# Patient Record
Sex: Male | Born: 1980 | Race: Black or African American | Hispanic: No | Marital: Married | Smoking: Former smoker
Health system: Southern US, Community
[De-identification: ages and names within clinical notes are randomized; demographics above are authoritative.]

## PROBLEM LIST (undated history)

## (undated) DIAGNOSIS — I10 Essential (primary) hypertension: Secondary | ICD-10-CM

## (undated) HISTORY — DX: Essential (primary) hypertension: I10

---

## 2006-05-20 ENCOUNTER — Ambulatory Visit: Payer: Self-pay | Admitting: Family Medicine

## 2006-05-21 ENCOUNTER — Ambulatory Visit: Payer: Self-pay | Admitting: *Deleted

## 2006-06-25 ENCOUNTER — Emergency Department (HOSPITAL_COMMUNITY): Admission: EM | Admit: 2006-06-25 | Discharge: 2006-06-25 | Payer: Self-pay | Admitting: Emergency Medicine

## 2006-07-01 ENCOUNTER — Ambulatory Visit: Payer: Self-pay | Admitting: Family Medicine

## 2007-04-22 ENCOUNTER — Encounter (INDEPENDENT_AMBULATORY_CARE_PROVIDER_SITE_OTHER): Payer: Self-pay | Admitting: *Deleted

## 2008-02-18 ENCOUNTER — Emergency Department (HOSPITAL_COMMUNITY): Admission: EM | Admit: 2008-02-18 | Discharge: 2008-02-18 | Payer: Self-pay | Admitting: Emergency Medicine

## 2008-02-20 ENCOUNTER — Emergency Department (HOSPITAL_COMMUNITY): Admission: EM | Admit: 2008-02-20 | Discharge: 2008-02-20 | Payer: Self-pay | Admitting: Emergency Medicine

## 2008-05-15 ENCOUNTER — Emergency Department (HOSPITAL_COMMUNITY): Admission: EM | Admit: 2008-05-15 | Discharge: 2008-05-15 | Payer: Self-pay | Admitting: Emergency Medicine

## 2011-05-03 LAB — URINALYSIS, ROUTINE W REFLEX MICROSCOPIC
Glucose, UA: NEGATIVE
Ketones, ur: NEGATIVE
Leukocytes, UA: NEGATIVE
Protein, ur: 100 — AB

## 2011-05-03 LAB — DIFFERENTIAL
Basophils Absolute: 0
Basophils Relative: 0
Eosinophils Absolute: 0.2
Eosinophils Relative: 2
Monocytes Absolute: 0.8

## 2011-05-03 LAB — CBC
HCT: 44.3
Hemoglobin: 15.5
MCHC: 34.9
MCV: 81.7
RDW: 12.8

## 2011-05-03 LAB — POCT I-STAT, CHEM 8
BUN: 12
BUN: 21
Calcium, Ion: 1.12
Calcium, Ion: 1 — ABNORMAL LOW
Chloride: 106
Glucose, Bld: 111 — ABNORMAL HIGH
TCO2: 27

## 2011-05-03 LAB — RAPID URINE DRUG SCREEN, HOSP PERFORMED
Amphetamines: NOT DETECTED
Cocaine: NOT DETECTED
Opiates: NOT DETECTED
Tetrahydrocannabinol: POSITIVE — AB

## 2011-07-30 ENCOUNTER — Emergency Department: Payer: Self-pay | Admitting: Emergency Medicine

## 2011-08-29 ENCOUNTER — Emergency Department: Payer: Self-pay | Admitting: Emergency Medicine

## 2011-08-29 LAB — CK TOTAL AND CKMB (NOT AT ARMC)
CK, Total: 488 U/L — ABNORMAL HIGH (ref 35–232)
CK-MB: 3.4 ng/mL (ref 0.5–3.6)

## 2011-08-29 LAB — BASIC METABOLIC PANEL
BUN: 13 mg/dL (ref 7–18)
EGFR (Non-African Amer.): >60
Glucose: 84 mg/dL (ref 65–99)
Potassium: 3.5 mmol/L (ref 3.5–5.1)
Sodium: 143 mmol/L (ref 136–145)

## 2011-08-29 LAB — CBC WITH DIFFERENTIAL/PLATELET
Basophil #: 0.1 10*3/uL (ref 0.0–0.1)
Eosinophil %: 3.6 %
Lymphocyte #: 2.9 10*3/uL (ref 1.0–3.6)
MCV: 85 fL (ref 80–100)
Monocyte %: 6.8 %
Neutrophil %: 56.8 %
Platelet: 280 10*3/uL (ref 150–440)
RBC: 4.69 10*6/uL (ref 4.40–5.90)
RDW: 12.9 % (ref 11.5–14.5)
WBC: 8.9 10*3/uL (ref 3.8–10.6)

## 2011-08-29 LAB — TROPONIN I: Troponin-I: <0.02 ng/mL

## 2011-11-24 ENCOUNTER — Emergency Department: Payer: Self-pay | Admitting: Emergency Medicine

## 2011-11-25 LAB — BASIC METABOLIC PANEL
Chloride: 107 mmol/L (ref 98–107)
Co2: 24 mmol/L (ref 21–32)
Glucose: 91 mg/dL (ref 65–99)
Osmolality: 286 (ref 275–301)
Sodium: 141 mmol/L (ref 136–145)

## 2011-11-25 LAB — CBC
HGB: 14.6 g/dL (ref 13.0–18.0)
MCV: 83 fL (ref 80–100)
RBC: 5.15 10*6/uL (ref 4.40–5.90)
RDW: 13.1 % (ref 11.5–14.5)

## 2011-11-25 LAB — TROPONIN I: Troponin-I: <0.02 ng/mL

## 2012-03-06 ENCOUNTER — Emergency Department: Payer: Self-pay | Admitting: Unknown Physician Specialty

## 2013-02-10 ENCOUNTER — Emergency Department: Payer: Self-pay | Admitting: Emergency Medicine

## 2013-07-07 ENCOUNTER — Ambulatory Visit (INDEPENDENT_AMBULATORY_CARE_PROVIDER_SITE_OTHER): Payer: BC Managed Care – PPO | Admitting: Internal Medicine

## 2013-07-07 ENCOUNTER — Encounter: Payer: Self-pay | Admitting: Internal Medicine

## 2013-07-07 VITALS — BP 126/90 | HR 76 | Temp 98.6°F | Ht 65.25 in | Wt 258.5 lb

## 2013-07-07 DIAGNOSIS — Z0000 Encounter for general adult medical examination without abnormal findings: Secondary | ICD-10-CM | POA: Insufficient documentation

## 2013-07-07 DIAGNOSIS — I10 Essential (primary) hypertension: Secondary | ICD-10-CM

## 2013-07-07 LAB — HEPATIC FUNCTION PANEL
ALT: 27 U/L (ref 0–53)
AST: 19 U/L (ref 0–37)
Albumin: 3.9 g/dL (ref 3.5–5.2)
Alkaline Phosphatase: 82 U/L (ref 39–117)
Bilirubin, Direct: 0 mg/dL (ref 0.0–0.3)
Total Bilirubin: 0.4 mg/dL (ref 0.3–1.2)
Total Protein: 7.8 g/dL (ref 6.0–8.3)

## 2013-07-07 LAB — LIPID PANEL
Cholesterol: 155 mg/dL (ref 0–200)
HDL: 28.2 mg/dL — ABNORMAL LOW (ref >39.00)
LDL Cholesterol: 95 mg/dL (ref 0–99)
Total CHOL/HDL Ratio: 5
Triglycerides: 158 mg/dL — ABNORMAL HIGH (ref 0.0–149.0)
VLDL: 31.6 mg/dL (ref 0.0–40.0)

## 2013-07-07 LAB — CBC WITH DIFFERENTIAL/PLATELET
Basophils Absolute: 0.1 K/uL (ref 0.0–0.1)
Basophils Relative: 0.6 % (ref 0.0–3.0)
Eosinophils Absolute: 0.3 K/uL (ref 0.0–0.7)
Eosinophils Relative: 3.4 % (ref 0.0–5.0)
HCT: 44 % (ref 39.0–52.0)
Hemoglobin: 14.8 g/dL (ref 13.0–17.0)
Lymphocytes Relative: 34.9 % (ref 12.0–46.0)
Lymphs Abs: 3.1 K/uL (ref 0.7–4.0)
MCHC: 33.7 g/dL (ref 30.0–36.0)
MCV: 82.8 fl (ref 78.0–100.0)
Monocytes Absolute: 0.6 K/uL (ref 0.1–1.0)
Monocytes Relative: 6.9 % (ref 3.0–12.0)
Neutro Abs: 4.9 K/uL (ref 1.4–7.7)
Neutrophils Relative %: 54.2 % (ref 43.0–77.0)
Platelets: 286 K/uL (ref 150.0–400.0)
RBC: 5.31 Mil/uL (ref 4.22–5.81)
RDW: 13.5 % (ref 11.5–14.6)
WBC: 9 K/uL (ref 4.5–10.5)

## 2013-07-07 LAB — BASIC METABOLIC PANEL WITH GFR
BUN: 15 mg/dL (ref 6–23)
CO2: 28 meq/L (ref 19–32)
Calcium: 9.2 mg/dL (ref 8.4–10.5)
Chloride: 104 meq/L (ref 96–112)
Creatinine, Ser: 1.4 mg/dL (ref 0.4–1.5)
GFR: 73.15 mL/min (ref >60.00)
Glucose, Bld: 93 mg/dL (ref 70–99)
Potassium: 3.9 meq/L (ref 3.5–5.1)
Sodium: 140 meq/L (ref 135–145)

## 2013-07-07 LAB — T4, FREE: Free T4: 0.87 ng/dL (ref 0.60–1.60)

## 2013-07-07 LAB — TSH: TSH: 1.65 u[IU]/mL (ref 0.35–5.50)

## 2013-07-07 NOTE — Assessment & Plan Note (Signed)
Healthy but out of shape Discussed fitness Diet info given

## 2013-07-07 NOTE — Progress Notes (Signed)
Subjective:    Patient ID: Scott Hodge, male    DOB: 16-Jun-1981, 32 y.o.   MRN: [id number]  HPI Transferring care Had been in prison until about 2 years ago. Had been selling cocaine. 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   No current facility-administered medications on file prior to visit.    No Known Allergies  Past Medical History  Diagnosis Date  . Hypertension     No past surgical history on file.  Family History  Problem Relation Age of Onset  . Hypertension Mother   . Hypertension Father   . Cancer Paternal Aunt     died of unknown cancer  . Diabetes Paternal Uncle   . Heart disease Neg Hx     History   Social History  . Marital Status: Married    Spouse Name: N/A    Number of Children: 2  . Years of Education: N/A   Occupational History  . Peer counselor     [employer]   Social History Main Topics  . Smoking status: Current Every Day Smoker -- 2 years    Types: Cigarettes  . Smokeless tobacco: Never Used     Comment: smokes only 3 per day, trying to quit  . Alcohol Use: Yes     Comment: socially  . Drug Use: No  . Sexual Activity: Not on file   Other Topics Concern  . Not on file   Social History Narrative   2 step children   Review of Systems  Constitutional: Negative for fatigue and unexpected weight change.       Wears seat belt  HENT: Positive for dental problem. Negative for congestion, hearing loss and rhinorrhea.        Needs some work done on teeth  Eyes: Negative for visual disturbance.        No diplopia or unilateral vision loss  Respiratory: Negative for cough, chest tightness and shortness of  breath.   Cardiovascular: Positive for palpitations. Negative for chest pain and leg swelling.       Palps if Scott Hodge misses meds  Gastrointestinal: Negative for nausea, vomiting, abdominal pain, constipation and blood in stool.       No heartburn  Endocrine: Negative for cold intolerance and heat intolerance.  Genitourinary: Negative for frequency and difficulty urinating.       Rare urgency on the diuretic  Musculoskeletal: Negative for arthralgias, back pain and joint swelling.  Skin: Positive for rash.       Mild eczema in past  Allergic/Immunologic: Negative for environmental allergies and immunocompromised state.  Neurological: Positive for headaches. Negative for dizziness, syncope, weakness, light-headedness and numbness.       Headaches if Scott Hodge misses BP meds  Hematological: Negative for adenopathy. Does not bruise/bleed easily.  Psychiatric/Behavioral: Negative for sleep disturbance and dysphoric mood. The patient is not nervous/anxious.        Objective:   Physical Exam  Constitutional: Scott Hodge is oriented to person, place, and time. Scott Hodge appears well-developed and well-nourished. No distress.  HENT:  Head: Normocephalic and atraumatic.  Right Ear: External ear normal.  Left Ear: External ear normal.  Mouth/Throat:  Oropharynx is clear and moist. No oropharyngeal exudate.  Eyes: Conjunctivae and EOM are normal. Pupils are equal, round, and reactive to light.  Neck: Normal range of motion. Neck supple. No thyromegaly present.  Cardiovascular: Normal rate, regular rhythm, normal heart sounds and intact distal pulses.  Exam reveals no gallop.   No murmur heard. Pulmonary/Chest: Effort normal and breath sounds normal. No respiratory distress. Scott Hodge has no wheezes. Scott Hodge has no rales.  Abdominal: Soft. There is no tenderness.  Musculoskeletal: Scott Hodge exhibits no edema and no tenderness.  Lymphadenopathy:    Scott Hodge has no cervical adenopathy.  Neurological: Scott Hodge is alert and oriented to person, place, and  time.  Skin: No rash noted. No erythema.  Psychiatric: Scott Hodge has a normal mood and affect. His behavior is normal.          Assessment & Plan:

## 2013-07-07 NOTE — Assessment & Plan Note (Signed)
BP Readings from Last 3 Encounters:  07/07/13 126/90   Has symptoms off the med Mild ED---would change to ACEI or CCB if worsens

## 2013-07-07 NOTE — Progress Notes (Signed)
Pre-visit discussion using our clinic review tool. No additional management support is needed unless otherwise documented below in the visit note.  

## 2013-07-07 NOTE — Patient Instructions (Signed)
DASH Diet  The DASH diet stands for "Dietary Approaches to Stop Hypertension." It is a healthy eating plan that has been shown to reduce high blood pressure (hypertension) in as little as 14 days, while also possibly providing other significant health benefits. These other health benefits include reducing the risk of breast cancer after menopause and reducing the risk of type 2 diabetes, heart disease, colon cancer, and stroke. Health benefits also include weight loss and slowing kidney failure in patients with chronic kidney disease.   DIET GUIDELINES  · Limit salt (sodium). Your diet should contain less than 1500 mg of sodium daily.  · Limit refined or processed carbohydrates. Your diet should include mostly whole grains. Desserts and added sugars should be used sparingly.  · Include small amounts of heart-healthy fats. These types of fats include nuts, oils, and tub margarine. Limit saturated and trans fats. These fats have been shown to be harmful in the body.  CHOOSING FOODS   The following food groups are based on a 2000 calorie diet. See your Registered Dietitian for individual calorie needs.  Grains and Grain Products (6 to 8 servings daily)  · Eat More Often: Whole-wheat bread, brown rice, whole-grain or wheat pasta, quinoa, popcorn without added fat or salt (air popped).  · Eat Less Often: White bread, white pasta, white rice, cornbread.  Vegetables (4 to 5 servings daily)  · Eat More Often: Fresh, frozen, and canned vegetables. Vegetables may be raw, steamed, roasted, or grilled with a minimal amount of fat.  · Eat Less Often/Avoid: Creamed or fried vegetables. Vegetables in a cheese sauce.  Fruit (4 to 5 servings daily)  · Eat More Often: All fresh, canned (in natural juice), or frozen fruits. Dried fruits without added sugar. One hundred percent fruit juice (½ cup [237 mL] daily).  · Eat Less Often: Dried fruits with added sugar. Canned fruit in light or heavy syrup.  Lean Meats, Fish, and Poultry (2  servings or less daily. One serving is 3 to 4 oz [85-114 g]).  · Eat More Often: Ninety percent or leaner ground beef, tenderloin, sirloin. Round cuts of beef, chicken breast, turkey breast. All fish. Grill, bake, or broil your meat. Nothing should be fried.  · Eat Less Often/Avoid: Fatty cuts of meat, turkey, or chicken leg, thigh, or wing. Fried cuts of meat or fish.  Dairy (2 to 3 servings)  · Eat More Often: Low-fat or fat-free milk, low-fat plain or light yogurt, reduced-fat or part-skim cheese.  · Eat Less Often/Avoid: Milk (whole, 2%). Whole milk yogurt. Full-fat cheeses.  Nuts, Seeds, and Legumes (4 to 5 servings per week)  · Eat More Often: All without added salt.  · Eat Less Often/Avoid: Salted nuts and seeds, canned beans with added salt.  Fats and Sweets (limited)  · Eat More Often: Vegetable oils, tub margarines without trans fats, sugar-free gelatin. Mayonnaise and salad dressings.  · Eat Less Often/Avoid: Coconut oils, palm oils, butter, stick margarine, cream, half and half, cookies, candy, pie.  FOR MORE INFORMATION  The Dash Diet Eating Plan: www.dashdiet.org  Document Released: 07/11/2011 Document Revised: 10/14/2011 Document Reviewed: 07/11/2011  ExitCare® Patient Information ©2014 [employer], LLC.

## 2013-07-08 ENCOUNTER — Other Ambulatory Visit: Payer: Self-pay | Admitting: Internal Medicine

## 2013-07-08 MED ORDER — HYDROCHLOROTHIAZIDE 25 MG PO TABS: 25.0000 mg | ORAL_TABLET | Freq: Every day | ORAL | Status: DC

## 2013-08-06 ENCOUNTER — Encounter: Payer: Self-pay | Admitting: Internal Medicine

## 2013-08-27 ENCOUNTER — Ambulatory Visit: Payer: BC Managed Care – PPO | Admitting: Internal Medicine

## 2013-08-27 ENCOUNTER — Telehealth: Payer: Self-pay | Admitting: Internal Medicine

## 2013-08-27 DIAGNOSIS — Z0289 Encounter for other administrative examinations: Secondary | ICD-10-CM

## 2013-08-27 NOTE — Telephone Encounter (Signed)
Patient Information:  Caller Name: Scott Hodge  Phone: [phone]  Patient: ,   Gender: Male  DOB: 08/02/1981  Age: 33 Years  PCP: [facility] [facility] Practice)  Office Follow Up:  Does the office need to follow up with this patient?: No  Instructions For The Office: N/A   Symptoms  Reason For Call & Symptoms: Seen at [facility] and dx with Cold Virus with sinus congestion two weeks ago. He was told to use nasal sprays and sinus sx are improved but R ear feels blocked. No pain but ear crackles and pops and feels moist. No pus or drainage.  Afebrile.  Reviewed Health History In EMR: Yes  Reviewed Medications In EMR: Yes  Reviewed Allergies In EMR: Yes  Reviewed Surgeries / Procedures: Yes  Date of Onset of Symptoms: 08/13/2013  Treatments Tried: Afrin Nasal Spray- used for 3 days and Saline Nasal spray several times daily  Treatments Tried Worked: No  Guideline(s) Used:  Ear - Congestion  Disposition Per Guideline:   See Within 3 Days in Office  Reason For Disposition Reached:   Ear congestion present > 48 hours  Advice Given:  Causes  Upper respiratory infections (colds) and nasal allergies (hay fever) can block the eustachian tube.  Blowing the nose too hard can also push air and fluid into the eustachian tube.  Caution - Nasal Decongestants:  Do not take these medications if you have high blood pressure, heart disease, prostate problems, or an overactive thyroid.  Expected Course:   The symptoms usually get better within 2 days (48 hours) with treatment.  Call Back If:   Ear congestion lasts over 48 hours  Ear pain or fever occurs  You become worse.  RN Overrode Recommendation:  Make Appointment  Appointment already scheduled for today 08/27/13 at 1500  Appointment Scheduled:  08/27/2013 15:00:00 Appointment Scheduled Provider:  Other

## 2013-08-27 NOTE — Telephone Encounter (Signed)
Will eval at [facility]

## 2014-03-22 ENCOUNTER — Encounter (HOSPITAL_COMMUNITY): Payer: Self-pay | Admitting: Emergency Medicine

## 2014-03-22 ENCOUNTER — Emergency Department (HOSPITAL_COMMUNITY)
Admission: EM | Admit: 2014-03-22 | Discharge: 2014-03-22 | Disposition: A | Discharge status: Home / Self Care | Payer: BC Managed Care – PPO | Attending: Emergency Medicine | Admitting: Emergency Medicine

## 2014-03-22 DIAGNOSIS — Z79899 Other long term (current) drug therapy: Secondary | ICD-10-CM | POA: Insufficient documentation

## 2014-03-22 DIAGNOSIS — L299 Pruritus, unspecified: Secondary | ICD-10-CM | POA: Insufficient documentation

## 2014-03-22 DIAGNOSIS — R21 Rash and other nonspecific skin eruption: Secondary | ICD-10-CM | POA: Insufficient documentation

## 2014-03-22 DIAGNOSIS — L309 Dermatitis, unspecified: Secondary | ICD-10-CM

## 2014-03-22 DIAGNOSIS — I10 Essential (primary) hypertension: Secondary | ICD-10-CM | POA: Insufficient documentation

## 2014-03-22 DIAGNOSIS — F17200 Nicotine dependence, unspecified, uncomplicated: Secondary | ICD-10-CM | POA: Insufficient documentation

## 2014-03-22 DIAGNOSIS — L259 Unspecified contact dermatitis, unspecified cause: Secondary | ICD-10-CM | POA: Insufficient documentation

## 2014-03-22 DIAGNOSIS — IMO0002 Reserved for concepts with insufficient information to code with codable children: Secondary | ICD-10-CM | POA: Insufficient documentation

## 2014-03-22 MED ORDER — PREDNISONE 20 MG PO TABS: 40.0000 mg | ORAL_TABLET | Freq: Every day | ORAL | Status: DC

## 2014-03-22 MED ORDER — DIPHENHYDRAMINE HCL 25 MG PO TABS: 25.0000 mg | ORAL_TABLET | Freq: Four times a day (QID) | ORAL | Status: DC | PRN

## 2014-03-22 MED ORDER — PERMETHRIN 5 % EX CREA: TOPICAL_CREAM | CUTANEOUS | Status: DC

## 2014-03-22 MED ORDER — RANITIDINE HCL 150 MG PO CAPS: 150.0000 mg | ORAL_CAPSULE | Freq: Every day | ORAL | Status: DC

## 2014-03-22 NOTE — ED Provider Notes (Signed)
CSN: [id number]     Arrival date & time 03/22/14  0510 History   First MD Initiated Contact with Patient 03/22/14 [phone]     Chief Complaint  Patient presents with  . Rash     (Consider location/radiation/quality/duration/timing/severity/associated sxs/prior Treatment) Patient is a 33 y.o. male presenting with rash. The history is provided by the patient. No language interpreter was used.  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 Hypertension    History reviewed. No pertinent past surgical history. Family History  Problem Relation Age of Onset  . Hypertension Mother   . Hypertension Father   . Cancer Paternal Aunt     died of unknown cancer  . Diabetes Paternal Uncle   . Heart disease Neg Hx    History  Substance Use Topics  . Smoking status: Current Every Day Smoker -- 0.10 packs/day for 2 years    Types: Cigarettes  . Smokeless tobacco: Never Used     Comment: smokes only 3 per day, trying to quit  . Alcohol Use: Yes     Comment: socially    Review of Systems  Constitutional: Negative for fever.  HENT: Negative for hoarse voice.   Respiratory: Negative for shortness of breath and wheezing.   Gastrointestinal: Negative for vomiting and diarrhea.  Musculoskeletal: Negative for arthralgias and myalgias.  Skin: Positive for rash.  All other systems  reviewed and are negative.    Allergies  Review of patient's allergies indicates no known allergies.  Home Medications   Prior to Admission medications   Medication Sig Start Date End Date Taking? Authorizing Provider  hydrochlorothiazide (HYDRODIURIL) 25 MG tablet Take 1 tablet (25 mg total) by mouth daily. 07/08/13  Yes Venia Carbon, MD  diphenhydrAMINE (BENADRYL) 25 MG tablet Take 1 tablet (25 mg total) by mouth every 6 (six) hours as needed for itching (Rash). 03/22/14   Antonietta Breach, PA-C  permethrin (ELIMITE) 5 % cream Apply to entire body other than face - let sit for 14 hours then wash off, may repeat in 1 week if still having symptoms 03/22/14   Antonietta Breach, PA-C  predniSONE (DELTASONE) 20 MG tablet Take 2 tablets (40 mg total) by mouth daily. For itching 03/22/14   Antonietta Breach, PA-C  ranitidine (ZANTAC) 150 MG capsule Take 1 capsule (150 mg total) by mouth daily. For itching 03/22/14   Antonietta Breach, PA-C   BP 120/64  Pulse 88  Temp(Src) 97.8 F (36.6 C) (Oral)  Resp 18  Ht 5\' 7"  (1.702 m)  Wt 255 lb (115.667 kg)  BMI 39.93 kg/m2  SpO2 96%  Physical Exam  Nursing note and vitals reviewed. Constitutional: He is oriented to person, place, and time. He appears well-developed and well-nourished. No distress.  Nontoxic/nonseptic appearing  HENT:  Head: Normocephalic and atraumatic.  Mouth/Throat: Oropharynx is clear and moist. No oropharyngeal exudate.  No oral lesions. Patient tolerating  secretions no difficulty. No angioedema. No voice changes.  Eyes: Conjunctivae and EOM are normal. No scleral icterus.  Neck: Normal range of motion.  Pulmonary/Chest: Effort normal. No respiratory distress. He has no wheezes.  Chest expansion symmetric  Musculoskeletal: Normal range of motion.  Neurological: He is alert and oriented to person, place, and time. He exhibits normal muscle tone. Coordination normal.  Skin: Skin is warm and dry. Rash noted. He is not diaphoretic. No erythema.  No pallor.  Diffuse punctate papular rash appreciated to trunk, back, and upper extremities. Rash is pruritic and mildly erythematous. No weeping, drainage, skin peeling, or plaques.  Psychiatric: He has a normal mood and affect. His behavior is normal.    ED Course  Procedures (including critical care time) Labs Review Labs Reviewed - No data to display  Imaging Review No results found.   EKG Interpretation None      MDM   Final diagnoses:  Dermatitis  Pruritus    33 year old male presents to the emergency department for further evaluation of rash. Patient well and nontoxic appearing, hemodynamically stable, and afebrile. No evidence of airway compromise or angioedema. No oral lesions. Rash not concerning for SJS, erythema multiforme major, or erythema multiforme minor. Symptoms consistent with contact dermatitis; however, given patient's occupation will cover for scabies. Patient stable for discharge with instruction to follow up with his primary care doctor. Return precautions provided and patient agreeable to plan with no unaddressed concerns.   Filed Vitals:   03/22/14 0519 03/22/14 0555  BP: 135/79 120/64  Pulse: 82 88  Temp: 97.8 F (36.6 C)   TempSrc: Oral   Resp: 16 18  Height: 5\' 7"  (1.702 m)   Weight: 255 lb (115.667 kg)   SpO2: 98% 96%     Antonietta Breach, PA-C 03/25/14 [phone]

## 2014-03-22 NOTE — ED Notes (Signed)
Patient here with complaint of diffuse rash over entire body. Explains that rash started a couple weeks ago and gradually has gotten worse. Currently appears as scattered hives and white spots. Denies shortness of breath, dizziness, light headedness, chest pain. States "my wife started using a new fabric softener".

## 2014-03-22 NOTE — Discharge Instructions (Signed)
Take zantac and prednisone as prescribed. You may also take Benadryl in addition for itching. Use Elimite as directed to cover for scabies. Recommend you also discontinue use of your new fabric softener. Follow up with your primary care doctor.  Contact Dermatitis Contact dermatitis is a reaction to certain substances that touch the skin. Contact dermatitis can be either irritant contact dermatitis or allergic contact dermatitis. Irritant contact dermatitis does not require previous exposure to the substance for a reaction to occur.Allergic contact dermatitis only occurs if you have been exposed to the substance before. Upon a repeat exposure, your body reacts to the substance.  CAUSES  Many substances can cause contact dermatitis. Irritant dermatitis is most commonly caused by repeated exposure to mildly irritating substances, such as:  Makeup.  Soaps.  Detergents.  Bleaches.  Acids.  Metal salts, such as nickel. Allergic contact dermatitis is most commonly caused by exposure to:  Poisonous plants.  Chemicals (deodorants, shampoos).  Jewelry.  Latex.  Neomycin in triple antibiotic cream.  Preservatives in products, including clothing. SYMPTOMS  The area of skin that is exposed may develop:  Dryness or flaking.  Redness.  Cracks.  Itching.  Pain or a burning sensation.  Blisters. With allergic contact dermatitis, there may also be swelling in areas such as the eyelids, mouth, or genitals.  DIAGNOSIS  Your caregiver can usually tell what the problem is by doing a physical exam. In cases where the cause is uncertain and an allergic contact dermatitis is suspected, a patch skin test may be performed to help determine the cause of your dermatitis. TREATMENT Treatment includes protecting the skin from further contact with the irritating substance by avoiding that substance if possible. Barrier creams, powders, and gloves may be helpful. Your caregiver may also  recommend:  Steroid creams or ointments applied 2 times daily. For best results, soak the rash area in cool water for 20 minutes. Then apply the medicine. Cover the area with a plastic wrap. You can store the steroid cream in the refrigerator for a "chilly" effect on your rash. That may decrease itching. Oral steroid medicines may be needed in more severe cases.  Antibiotics or antibacterial ointments if a skin infection is present.  Antihistamine lotion or an antihistamine taken by mouth to ease itching.  Lubricants to keep moisture in your skin.  Burow's solution to reduce redness and soreness or to dry a weeping rash. Mix one packet or tablet of solution in 2 cups cool water. Dip a clean washcloth in the mixture, wring it out a bit, and put it on the affected area. Leave the cloth in place for 30 minutes. Do this as often as possible throughout the day.  Taking several cornstarch or baking soda baths daily if the area is too large to cover with a washcloth. Harsh chemicals, such as alkalis or acids, can cause skin damage that is like a burn. You should flush your skin for 15 to 20 minutes with cold water after such an exposure. You should also seek immediate medical care after exposure. Bandages (dressings), antibiotics, and pain medicine may be needed for severely irritated skin.  HOME CARE INSTRUCTIONS  Avoid the substance that caused your reaction.  Keep the area of skin that is affected away from hot water, soap, sunlight, chemicals, acidic substances, or anything else that would irritate your skin.  Do not scratch the rash. Scratching may cause the rash to become infected.  You may take cool baths to help stop the itching.  Only take over-the-counter or prescription medicines as directed by your caregiver.  See your caregiver for follow-up care as directed to make sure your skin is healing properly. SEEK MEDICAL CARE IF:   Your condition is not better after 3 days of  treatment.  You seem to be getting worse.  You see signs of infection such as swelling, tenderness, redness, soreness, or warmth in the affected area.  You have any problems related to your medicines. Document Released: 07/19/2000 Document Revised: 10/14/2011 Document Reviewed: 12/25/2010 [facility] Patient Information 2015 [place], [place]. This information is not intended to replace advice given to you by your health care provider. Make sure you discuss any questions you have with your health care provider.  Scabies Scabies are small bugs (mites) that burrow under the skin and cause red bumps and severe itching. These bugs can only be seen with a microscope. Scabies are highly contagious. They can spread easily from person to person by direct contact. They are also spread through sharing clothing or linens that have the scabies mites living in them. It is not unusual for an entire family to become infected through shared towels, clothing, or bedding.  HOME CARE INSTRUCTIONS   Your caregiver may prescribe a cream or lotion to kill the mites. If cream is prescribed, massage the cream into the entire body from the neck to the bottom of both feet. Also massage the cream into the scalp and face if your child is less than 424 year old. Avoid the eyes and mouth. Do not wash your hands after application.  Leave the cream on for 8 to 12 hours. Your child should bathe or shower after the 8 to 12 hour application period. Sometimes it is helpful to apply the cream to your child right before bedtime.  One treatment is usually effective and will eliminate approximately 95% of infestations. For severe cases, your caregiver may decide to repeat the treatment in 1 week. Everyone in your household should be treated with one application of the cream.  New rashes or burrows should not appear within 24 to 48 hours after successful treatment. However, the itching and rash may last for 2 to 4 weeks after successful  treatment. Your caregiver may prescribe a medicine to help with the itching or to help the rash go away more quickly.  Scabies can live on clothing or linens for up to 3 days. All of your child's recently used clothing, towels, stuffed toys, and bed linens should be washed in hot water and then dried in a dryer for at least 20 minutes on high heat. Items that cannot be washed should be enclosed in a plastic bag for at least 3 days.  To help relieve itching, bathe your child in a cool bath or apply cool washcloths to the affected areas.  Your child may return to school after treatment with the prescribed cream. SEEK MEDICAL CARE IF:   The itching persists longer than 4 weeks after treatment.  The rash spreads or becomes infected. Signs of infection include red blisters or yellow-tan crust. Document Released: 07/22/2005 Document Revised: 10/14/2011 Document Reviewed: 11/30/2008 [facility] AssociatesExitCare Patient Information 2015 [place]Care, [place]. This information is not intended to replace advice given to you by your health care provider. Make sure you discuss any questions you have with your health care provider.

## 2014-03-25 NOTE — ED Provider Notes (Signed)
Medical screening examination/treatment/procedure(s) were performed by non-physician practitioner and as supervising physician I was immediately available for consultation/collaboration.   EKG Interpretation None       Olivia Mackielga M Esthela Brandner, MD 03/25/14 2114

## 2014-04-11 ENCOUNTER — Emergency Department (HOSPITAL_COMMUNITY)
Admission: EM | Admit: 2014-04-11 | Discharge: 2014-04-11 | Disposition: A | Discharge status: Home / Self Care | Payer: BC Managed Care – PPO | Attending: Emergency Medicine | Admitting: Emergency Medicine

## 2014-04-11 ENCOUNTER — Encounter (HOSPITAL_COMMUNITY): Payer: Self-pay | Admitting: Emergency Medicine

## 2014-04-11 DIAGNOSIS — F17200 Nicotine dependence, unspecified, uncomplicated: Secondary | ICD-10-CM | POA: Insufficient documentation

## 2014-04-11 DIAGNOSIS — IMO0002 Reserved for concepts with insufficient information to code with codable children: Secondary | ICD-10-CM | POA: Insufficient documentation

## 2014-04-11 DIAGNOSIS — J3489 Other specified disorders of nose and nasal sinuses: Secondary | ICD-10-CM | POA: Insufficient documentation

## 2014-04-11 DIAGNOSIS — Z79899 Other long term (current) drug therapy: Secondary | ICD-10-CM | POA: Insufficient documentation

## 2014-04-11 DIAGNOSIS — H6590 Unspecified nonsuppurative otitis media, unspecified ear: Secondary | ICD-10-CM | POA: Insufficient documentation

## 2014-04-11 DIAGNOSIS — I10 Essential (primary) hypertension: Secondary | ICD-10-CM | POA: Insufficient documentation

## 2014-04-11 DIAGNOSIS — H9209 Otalgia, unspecified ear: Secondary | ICD-10-CM | POA: Insufficient documentation

## 2014-04-11 DIAGNOSIS — H6591 Unspecified nonsuppurative otitis media, right ear: Secondary | ICD-10-CM

## 2014-04-11 MED ORDER — AMOXICILLIN 500 MG PO CAPS: 500.0000 mg | ORAL_CAPSULE | Freq: Three times a day (TID) | ORAL | Status: DC

## 2014-04-11 NOTE — ED Provider Notes (Signed)
CSN: [id number]     Arrival date & time 04/11/14  9528 History  This chart was scribed for non-physician practitioner Roxy Horseman, PA-C working with Vida Roller, MD by Leone Payor, ED Scribe. This patient was seen in room TR07C/TR07C and the patient's care was started at 9:12 AM.    No chief complaint on file.   The history is provided by the patient. No language interpreter was used.    HPI Comments: Scott Hodge is a 33 y.o. male who presents to the Emergency Department complaining of 2 weeks of gradual onset, constant right sided otalgia with associated sinus congestion. Patient states he has noticed the affected ear pop and reports associated muffled hearing. He has taken sudafed and mucinex without significant relief. He reports similar symptoms in the past with prior URI symptoms. He denies cough, documented fever.   Past Medical History  Diagnosis Date  . Hypertension    History reviewed. No pertinent past surgical history. Family History  Problem Relation Age of Onset  . Hypertension Mother   . Hypertension Father   . Cancer Paternal Aunt     died of unknown cancer  . Diabetes Paternal Uncle   . Heart disease Neg Hx    History  Substance Use Topics  . Smoking status: Current Every Day Smoker -- 0.10 packs/day for 2 years    Types: Cigarettes  . Smokeless tobacco: Never Used     Comment: smokes only 3 per day, trying to quit  . Alcohol Use: Yes     Comment: socially    Review of Systems  Constitutional: Positive for fever ( subjective). Negative for chills.  HENT: Positive for congestion, ear pain, hearing loss (muffled hearing) and sinus pressure.   Respiratory: Negative for shortness of breath.   Cardiovascular: Negative for chest pain.  Gastrointestinal: Negative for nausea, vomiting, diarrhea and constipation.  Genitourinary: Negative for dysuria.      Allergies  Review of patient's allergies indicates no known allergies.  Home Medications   Prior  to Admission medications   Medication Sig Start Date End Date Taking? Authorizing Provider  diphenhydrAMINE (BENADRYL) 25 MG tablet Take 1 tablet (25 mg total) by mouth every 6 (six) hours as needed for itching (Rash). 03/22/14   Antony Madura, PA-C  hydrochlorothiazide (HYDRODIURIL) 25 MG tablet Take 1 tablet (25 mg total) by mouth daily. 07/08/13   Karie Schwalbe, MD  permethrin (ELIMITE) 5 % cream Apply to entire body other than face - let sit for 14 hours then wash off, may repeat in 1 week if still having symptoms 03/22/14   Antony Madura, PA-C  predniSONE (DELTASONE) 20 MG tablet Take 2 tablets (40 mg total) by mouth daily. For itching 03/22/14   Antony Madura, PA-C  ranitidine (ZANTAC) 150 MG capsule Take 1 capsule (150 mg total) by mouth daily. For itching 03/22/14   Antony Madura, PA-C   BP 132/74  Pulse 83  Temp(Src) 98.4 F (36.9 C) (Oral)  Resp 20  Ht  (1.702 m)  Wt 254 lb 14.6 oz (115.629 kg)  BMI 39.92 kg/m2  SpO2 98% Physical Exam  Nursing note and vitals reviewed. Constitutional: He is oriented to person, place, and time. He appears well-developed and well-nourished.  HENT:  Head: Normocephalic and atraumatic.  Right Ear: External ear normal.  Left Ear: External ear normal.  Mouth/Throat: Oropharynx is clear and moist. No oropharyngeal exudate.  Right TM is erythematous with some effusion seen behind the TM. Left TM is  clear.   Neck: Normal range of motion. Neck supple.  Cardiovascular: Normal rate, regular rhythm and normal heart sounds.  Exam reveals no gallop and no friction rub.   No murmur heard. Pulmonary/Chest: Effort normal and breath sounds normal. No respiratory distress. He has no wheezes. He has no rales.  Abdominal: He exhibits no distension.  Lymphadenopathy:    He has no cervical adenopathy.  Neurological: He is alert and oriented to person, place, and time.  Skin: Skin is warm and dry.  Psychiatric: He has a normal mood and affect.    ED Course   Procedures (including critical care time)  DIAGNOSTIC STUDIES: Oxygen Saturation is 98% on RA, normal by my interpretation.    COORDINATION OF CARE: 9:14 AM Discussed treatment plan with pt at bedside and pt agreed to plan.   Labs Review Labs Reviewed - No data to display  Imaging Review No results found.   EKG Interpretation None      MDM   Final diagnoses:  Right serous otitis media, recurrence not specified, unspecified chronicity    Patient with OM, sick for 2 weeks.  Will treat with amox.  Follow-up with PCP.  I personally performed the services described in this documentation, which was scribed in my presence. The recorded information has been reviewed and is accurate.    Roxy Horseman, PA-C 04/11/14 [phone]

## 2014-04-11 NOTE — Discharge Instructions (Signed)

## 2014-04-11 NOTE — ED Provider Notes (Signed)
Medical screening examination/treatment/procedure(s) were performed by non-physician practitioner and as supervising physician I was immediately available for consultation/collaboration.    Vida Roller, MD 04/11/14 [phone]

## 2014-04-11 NOTE — ED Notes (Signed)
Started 2 weeks ago with "sinus problems". Now has bilateral ear pain, right > left.

## 2014-05-29 ENCOUNTER — Emergency Department (HOSPITAL_COMMUNITY)
Admission: EM | Admit: 2014-05-29 | Discharge: 2014-05-29 | Disposition: A | Discharge status: Home / Self Care | Payer: BC Managed Care – PPO | Attending: Emergency Medicine | Admitting: Emergency Medicine

## 2014-05-29 ENCOUNTER — Encounter (HOSPITAL_COMMUNITY): Payer: Self-pay | Admitting: Emergency Medicine

## 2014-05-29 DIAGNOSIS — R51 Headache: Secondary | ICD-10-CM | POA: Insufficient documentation

## 2014-05-29 DIAGNOSIS — R42 Dizziness and giddiness: Secondary | ICD-10-CM | POA: Insufficient documentation

## 2014-05-29 DIAGNOSIS — Z792 Long term (current) use of antibiotics: Secondary | ICD-10-CM | POA: Insufficient documentation

## 2014-05-29 DIAGNOSIS — Z7952 Long term (current) use of systemic steroids: Secondary | ICD-10-CM | POA: Insufficient documentation

## 2014-05-29 DIAGNOSIS — I10 Essential (primary) hypertension: Secondary | ICD-10-CM | POA: Insufficient documentation

## 2014-05-29 DIAGNOSIS — Z720 Tobacco use: Secondary | ICD-10-CM | POA: Insufficient documentation

## 2014-05-29 DIAGNOSIS — H6983 Other specified disorders of Eustachian tube, bilateral: Secondary | ICD-10-CM

## 2014-05-29 DIAGNOSIS — H6993 Unspecified Eustachian tube disorder, bilateral: Secondary | ICD-10-CM | POA: Insufficient documentation

## 2014-05-29 DIAGNOSIS — Z79899 Other long term (current) drug therapy: Secondary | ICD-10-CM | POA: Insufficient documentation

## 2014-05-29 DIAGNOSIS — J3489 Other specified disorders of nose and nasal sinuses: Secondary | ICD-10-CM | POA: Insufficient documentation

## 2014-05-29 MED ORDER — PSEUDOEPHEDRINE HCL 30 MG PO TABS: 30.0000 mg | ORAL_TABLET | Freq: Four times a day (QID) | ORAL | Status: DC | PRN

## 2014-05-29 NOTE — ED Notes (Signed)
Pt c/o right ear feeling clogged x several weeks; pt sts hx of recent URI and has happened in past

## 2014-05-29 NOTE — ED Provider Notes (Signed)
Medical screening examination/treatment/procedure(s) were performed by non-physician practitioner and as supervising physician I was immediately available for consultation/collaboration.   EKG Interpretation None        Rorik Vespa, MD 05/29/14 1658 

## 2014-05-29 NOTE — Discharge Instructions (Signed)
Please read and follow all provided instructions.  Your diagnoses today include:  1. Eustachian tube dysfunction, bilateral     Tests performed today include:  Vital signs. See below for your results today.   Medications prescribed:   Sudafed - decongestant medication  Home care instructions:  Follow any educational materials contained in this packet.  Follow-up instructions: Please follow-up with the ear doctor listed in one week if your symptoms are not improved.   Return instructions:   Please return to the Emergency Department if you experience worsening symptoms.   Please return if you have any other emergent concerns.  Additional Information:  Your vital signs today were: BP 141/85   Pulse 88   Temp(Src) 98 F (36.7 C) (Oral)   Resp 18   Ht 5\' 7"  (1.702 m)   Wt 250 lb (113.399 kg)   BMI 39.15 kg/m2   SpO2 98% If your blood pressure (BP) was elevated above 135/85 this visit, please have this repeated by your doctor within one month. ---------------

## 2014-05-29 NOTE — ED Provider Notes (Signed)
CSN: [id number]     Arrival date & time 05/29/14  1033 History  This chart was scribed for Scott CriglerJoshua Aamani Moose, PA-C, working with Gwyneth SproutWhitney Plunkett, MD by Chestine SporeSoijett Blue, ED Scribe. The patient was seen in room TR08C/TR08C at 11:06 AM.    Chief Complaint  Patient presents with  . Otalgia    The history is provided by the patient. No language interpreter was used.   Scott Hodge is a 33 y.o. male who presents today complaining of otalgia onset several weeks. He states that 2 months ago he had an ear infection and URI. He states that he is now having muffled hearing. He states that he is having associated symptoms of ear pain, muffled sounds, rhinorrhea, congestion, HA, and dizziness. He states that he using q-tips to get the water out his ear when he showers, but he does not dig all the way in. He denies any other symptoms.    Past Medical History  Diagnosis Date  . Hypertension    History reviewed. No pertinent past surgical history. Family History  Problem Relation Age of Onset  . Hypertension Mother   . Hypertension Father   . Cancer Paternal Aunt     died of unknown cancer  . Diabetes Paternal Uncle   . Heart disease Neg Hx    History  Substance Use Topics  . Smoking status: Current Every Day Smoker -- 0.10 packs/day for 2 years    Types: Cigarettes  . Smokeless tobacco: Never Used     Comment: smokes only 3 per day, trying to quit  . Alcohol Use: Yes     Comment: socially    Review of Systems  Constitutional: Negative for fever, chills and fatigue.  HENT: Positive for congestion, ear pain and rhinorrhea. Negative for sinus pressure and sore throat.        Muffled hearing  Eyes: Negative for redness.  Respiratory: Negative for cough and wheezing.   Gastrointestinal: Negative for nausea, vomiting, abdominal pain and diarrhea.  Genitourinary: Negative for dysuria.  Musculoskeletal: Negative for myalgias and neck stiffness.  Skin: Negative for rash.  Neurological: Positive  for dizziness and headaches.  Hematological: Negative for adenopathy.    Allergies  Review of patient's allergies indicates no known allergies.  Home Medications   Prior to Admission medications   Medication Sig Start Date End Date Taking? Authorizing Provider  amoxicillin (AMOXIL) 500 MG capsule Take 1 capsule (500 mg total) by mouth 3 (three) times daily. 04/11/14   Roxy Horsemanobert Browning, PA-C  diphenhydrAMINE (BENADRYL) 25 MG tablet Take 1 tablet (25 mg total) by mouth every 6 (six) hours as needed for itching (Rash). 03/22/14   Antony MaduraKelly Humes, PA-C  hydrochlorothiazide (HYDRODIURIL) 25 MG tablet Take 1 tablet (25 mg total) by mouth daily. 07/08/13   Karie Schwalbeichard I Letvak, MD  permethrin (ELIMITE) 5 % cream Apply to entire body other than face - let sit for 14 hours then wash off, may repeat in 1 week if still having symptoms 03/22/14   Antony MaduraKelly Humes, PA-C  predniSONE (DELTASONE) 20 MG tablet Take 2 tablets (40 mg total) by mouth daily. For itching 03/22/14   Antony MaduraKelly Humes, PA-C  ranitidine (ZANTAC) 150 MG capsule Take 1 capsule (150 mg total) by mouth daily. For itching 03/22/14   Antony MaduraKelly Humes, PA-C   BP 141/85  Pulse 88  Temp(Src) 98 F (36.7 C) (Oral)  Resp 18  Ht 5\' 7"  (1.702 m)  Wt 250 lb (113.399 kg)  BMI 39.15 kg/m2  SpO2 98%  Physical Exam  Nursing note and vitals reviewed. Constitutional: He is oriented to person, place, and time. He appears well-developed and well-nourished. No distress.  HENT:  Head: Normocephalic and atraumatic.  Right Ear: External ear and ear canal normal. Tympanic membrane is retracted. Tympanic membrane is not erythematous.  Left Ear: External ear and ear canal normal. Tympanic membrane is retracted. Tympanic membrane is not erythematous.  Nose: Mucosal edema present. No rhinorrhea.  Mouth/Throat: Uvula is midline, oropharynx is clear and moist and mucous membranes are normal. Mucous membranes are not dry. No trismus in the jaw. No uvula swelling. No oropharyngeal  exudate, posterior oropharyngeal edema, posterior oropharyngeal erythema or tonsillar abscesses.  Eyes: Conjunctivae and EOM are normal. Right eye exhibits no discharge. Left eye exhibits no discharge.  Neck: Normal range of motion. Neck supple. No tracheal deviation present.  Cardiovascular: Normal rate, regular rhythm and normal heart sounds.   Pulmonary/Chest: Effort normal and breath sounds normal. No respiratory distress. He has no wheezes. He has no rales.  Abdominal: Soft. There is no tenderness.  Musculoskeletal: Normal range of motion.  Neurological: He is alert and oriented to person, place, and time.  Skin: Skin is warm and dry.  Psychiatric: He has a normal mood and affect. His behavior is normal.    ED Course  Procedures (including critical care time) DIAGNOSTIC STUDIES: Oxygen Saturation is 98% on room air, normal by my interpretation.    COORDINATION OF CARE: 11:13 AM-Discussed treatment plan which includes referral and F/U  to ENT doctor in 1 week if the symptoms persist and Sudafed with pt at bedside and pt agreed to plan.   Labs Review Labs Reviewed - No data to display  Imaging Review No results found.   EKG Interpretation None      Vital signs reviewed and are as follows: Filed Vitals:   05/29/14 1045  BP: 141/85  Pulse: 88  Temp: 98 F (36.7 C)  Resp: 18      MDM   Final diagnoses:  Eustachian tube dysfunction, bilateral   Pt with s/s c/w eustachian tube dysfunction. No infection seen. No TM trauma. ENT f/u given frequent and bothersome symptoms recommended if not improved with decongestant.   I personally performed the services described in this documentation, which was scribed in my presence. The recorded information has been reviewed and is accurate.    Scott CriglerJoshua Kole Hilyard, PA-C 05/29/14 1129

## 2014-08-15 ENCOUNTER — Ambulatory Visit: Payer: Self-pay | Admitting: Internal Medicine

## 2014-08-18 ENCOUNTER — Other Ambulatory Visit: Payer: Self-pay

## 2014-08-18 MED ORDER — HYDROCHLOROTHIAZIDE 25 MG PO TABS: 25.0000 mg | ORAL_TABLET | Freq: Every day | ORAL | Status: DC

## 2014-08-18 NOTE — Telephone Encounter (Signed)
Pt left v/m requesting refill HCTZ to [facility]. Pt out of med. Pt request cb. Pt last seen 07/07/2013 and no future appt scheduled.Please advise.

## 2014-08-18 NOTE — Telephone Encounter (Signed)
.  left message to have patient return my call.  

## 2014-08-18 NOTE — Telephone Encounter (Signed)
Patient called back and stated he forgot about the appt but states he will call back to schedule and appt ASAP I advised he could not get anymore refills until he was seen. Pt no-showed on Monday, I will give a 30day supply and per pt he will come in to be seen.

## 2014-09-13 ENCOUNTER — Ambulatory Visit (INDEPENDENT_AMBULATORY_CARE_PROVIDER_SITE_OTHER): Payer: 59 | Admitting: Internal Medicine

## 2014-09-13 ENCOUNTER — Encounter: Payer: Self-pay | Admitting: Internal Medicine

## 2014-09-13 VITALS — BP 118/78 | HR 84 | Temp 98.5°F | Wt 246.5 lb

## 2014-09-13 DIAGNOSIS — I10 Essential (primary) hypertension: Secondary | ICD-10-CM

## 2014-09-13 DIAGNOSIS — H6990 Unspecified Eustachian tube disorder, unspecified ear: Secondary | ICD-10-CM | POA: Insufficient documentation

## 2014-09-13 DIAGNOSIS — H6983 Other specified disorders of Eustachian tube, bilateral: Secondary | ICD-10-CM

## 2014-09-13 DIAGNOSIS — H6980 Other specified disorders of Eustachian tube, unspecified ear: Secondary | ICD-10-CM | POA: Insufficient documentation

## 2014-09-13 LAB — RENAL FUNCTION PANEL
ALBUMIN: 4 g/dL (ref 3.5–5.2)
BUN: 24 mg/dL — AB (ref 6–23)
CHLORIDE: 106 meq/L (ref 96–112)
CO2: 29 mEq/L (ref 19–32)
Calcium: 9.7 mg/dL (ref 8.4–10.5)
Creatinine, Ser: 1.46 mg/dL (ref 0.40–1.50)
GFR: 70.36 mL/min (ref >60.00)
Glucose, Bld: 107 mg/dL — ABNORMAL HIGH (ref 70–99)
PHOSPHORUS: 2.6 mg/dL (ref 2.3–4.6)
Potassium: 3.8 mEq/L (ref 3.5–5.1)
Sodium: 139 mEq/L (ref 135–145)

## 2014-09-13 MED ORDER — HYDROCHLOROTHIAZIDE 25 MG PO TABS: 25.0000 mg | ORAL_TABLET | Freq: Every day | ORAL | Status: DC

## 2014-09-13 NOTE — Progress Notes (Signed)
Pre visit review using our clinic review tool, if applicable. No additional management support is needed unless otherwise documented below in the visit note. 

## 2014-09-13 NOTE — Assessment & Plan Note (Signed)
BP Readings from Last 3 Encounters:  09/13/14 118/78  05/29/14 141/85  04/11/14 132/74   Good control No change needed Will check labs

## 2014-09-13 NOTE — Patient Instructions (Signed)
Please try nasacort or flonase (available over the counter)--- 2 sprays in each nostril daily. If that hasn't helped your ears in 1-2 months, set up with an ENT physician.

## 2014-09-13 NOTE — Progress Notes (Signed)
   Subjective:    Patient ID: Scott Hodge, male    DOB: July 24, 1981, 34 y.o.   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 hydrochlorothiazide (HYDRODIURIL) 25 MG tablet Take 1 tablet (25 mg total) by mouth daily. 30 tablet 0  . pseudoephedrine (SUDAFED) 30 MG tablet Take 1 tablet (30 mg total) by mouth every 6 (six) hours as needed for congestion. 30 tablet 0   No current facility-administered medications on file prior to visit.    No Known Allergies  Past Medical History  Diagnosis Date  . Hypertension     No past surgical history on file.  Family History  Problem Relation Age of Onset  . Hypertension Mother   . Hypertension Father   . Cancer Paternal Aunt     died of unknown cancer  . Diabetes Paternal Uncle   . Heart disease Neg Hx     History   Social History  . Marital Status: Married    Spouse Name: N/A    Number of Children: 2  . Years of Education: N/A   Occupational History  . Peer counselor     [employer]   Social History Main Topics  . Smoking status: Former Smoker -- 0.10 packs/day for 2 years    Types: Cigarettes  . Smokeless tobacco: Never Used     Comment: quit October 2015  . Alcohol Use: 0.0 oz/week    0 Not specified per week     Comment: socially  . Drug Use: No  . Sexual Activity: Not on file   Other Topics Concern  . Not on file   Social History Narrative   2 step children   Review of Systems [place] No ED problems any more    Objective:   Physical Exam    Constitutional: He appears well-developed and well-nourished. No distress.  HENT:  TMs not inflamed--- neutral position. ?some fluid Marked nasal swelling bilaterally  Neck: Normal range of motion. Neck supple. No thyromegaly present.  Cardiovascular: Normal rate, regular rhythm and normal heart sounds.  Exam reveals no gallop.   No murmur heard. Pulmonary/Chest: Effort normal and breath sounds normal. No respiratory distress. He has no wheezes. He has no rales.  Musculoskeletal: He exhibits no edema.  Lymphadenopathy:    He has no cervical adenopathy.  Psychiatric: He has a normal mood and affect. His behavior is normal.          Assessment & Plan:

## 2014-09-13 NOTE — Assessment & Plan Note (Signed)
Will try nasal steroids then ENT if not improved

## 2014-09-14 ENCOUNTER — Ambulatory Visit: Payer: Self-pay | Admitting: Internal Medicine

## 2014-09-20 ENCOUNTER — Telehealth: Payer: Self-pay | Admitting: Internal Medicine

## 2014-09-20 NOTE — Telephone Encounter (Signed)
emmi emailed °

## 2014-11-04 ENCOUNTER — Ambulatory Visit: Payer: 59 | Admitting: Family Medicine

## 2014-11-04 DIAGNOSIS — Z0289 Encounter for other administrative examinations: Secondary | ICD-10-CM

## 2014-11-07 ENCOUNTER — Telehealth: Payer: Self-pay | Admitting: Internal Medicine

## 2014-11-07 DIAGNOSIS — H6983 Other specified disorders of Eustachian tube, bilateral: Secondary | ICD-10-CM

## 2014-11-07 NOTE — Telephone Encounter (Signed)
Referral placed.

## 2014-11-07 NOTE — Telephone Encounter (Signed)
[facility] on [address]  Pt states Flonase you recommended did not work.  Pt requests c/b. Thank you.

## 2014-11-08 ENCOUNTER — Encounter: Payer: Self-pay | Admitting: *Deleted

## 2014-11-08 ENCOUNTER — Encounter: Payer: Self-pay | Admitting: Internal Medicine

## 2014-11-08 ENCOUNTER — Ambulatory Visit (INDEPENDENT_AMBULATORY_CARE_PROVIDER_SITE_OTHER): Payer: 59 | Admitting: Internal Medicine

## 2014-11-08 VITALS — BP 140/90 | HR 89 | Temp 98.1°F

## 2014-11-08 DIAGNOSIS — H6501 Acute serous otitis media, right ear: Secondary | ICD-10-CM | POA: Insufficient documentation

## 2014-11-08 MED ORDER — AMOXICILLIN-POT CLAVULANATE 875-125 MG PO TABS: 1.0000 | ORAL_TABLET | Freq: Two times a day (BID) | ORAL | Status: DC

## 2014-11-08 NOTE — Patient Instructions (Signed)
If you are not better after the antibiotic, I will go ahead with the referral to ENT specialist

## 2014-11-08 NOTE — Progress Notes (Signed)
Pre visit review using our clinic review tool, if applicable. No additional management support is needed unless otherwise documented below in the visit note. 

## 2014-11-08 NOTE — Progress Notes (Signed)
   Subjective:    Patient ID: Scott Hodge, male    DOB: 1981-03-06, 34 y.o.   MRN: [id number]  HPI Here with wife  Has ongoing problems with his ears Now having generalized body aches Started 2 days ago in his legs--- and then got cold and shakes at night Then had sweats that night Feels like he has been overworking--every muscle hurts  May have had fever--felt hot Has had ongoing head congestion flonase not helping Has sneezing and headaches also Gets off balance feeling --then fluttering feeling in right ear  Has tried alka seltzer  Antihistamines not really helpful either over the past 2 months  Current Outpatient Prescriptions on File Prior to Visit  Medication Sig Dispense Refill  . hydrochlorothiazide (HYDRODIURIL) 25 MG tablet Take 1 tablet (25 mg total) by mouth daily. 90 tablet 3   No current facility-administered medications on file prior to visit.    No Known Allergies  Past Medical History  Diagnosis Date  . Hypertension     No past surgical history on file.  Family History  Problem Relation Age of Onset  . Hypertension Mother   . Hypertension Father   . Cancer Paternal Aunt     died of unknown cancer  . Diabetes Paternal Uncle   . Heart disease Neg Hx     History   Social History  . Marital Status: Married    Spouse Name: N/A  . Number of Children: 2  . Years of Education: N/A   Occupational History  . Peer counselor     [employer]   Social History Main Topics  . Smoking status: Former Smoker -- 0.10 packs/day for 2 years    Types: Cigarettes  . Smokeless tobacco: Never Used     Comment: quit October 2015  . Alcohol Use: 0.0 oz/week    0 Standard drinks or equivalent per week     Comment: socially  . Drug Use: No  . Sexual Activity: Not on file   Other Topics Concern  . Not on file   Social History Narrative   2 step children   Review of Systems Still has rash on right foot Will get some DOE if he really overdoes  it--may be from nasal obstruction No N/V and appetite is okay Loose stools with the cold sweats    Objective:   Physical Exam  Constitutional: He appears well-developed and well-nourished. No distress.  HENT:  Mild maxillary tenderness Moderate nasal inflammation Fluid behind right TM--slight inflammation Left TM is retracted 1+ tonsils with small exudate on right  Neck: Normal range of motion. Neck supple. No thyromegaly present.  Small non tender anterior cervical nodes  Pulmonary/Chest: Effort normal and breath sounds normal. No respiratory distress. He has no wheezes. He has no rales.  Skin:  Keloid on top of right foot--reassured          Assessment & Plan:

## 2014-11-08 NOTE — Assessment & Plan Note (Addendum)
May be cause of systemic symptoms Will treat with augmentin If not better after antibiotic, will proceed with ENT eval [facility]

## 2014-11-10 ENCOUNTER — Telehealth: Payer: Self-pay | Admitting: Internal Medicine

## 2014-11-10 NOTE — Telephone Encounter (Signed)
[facility] - Client TELEPHONE ADVICE RECORD [facility] Patient Name: Scott Hodge DOB: 05-30-1981 Initial Comment Caller states his muscles ache. Nurse Assessment Nurse: Orvis Brillowland, RN, Olegario MessierKathy Date/Time Lamount Cohen(Eastern Time): 11/10/2014 3:20:46 PM Confirm and document reason for call. If symptomatic, describe symptoms. ---Caller states that he was seen in office on Tuesday secondary to generalized muscle aches that started on Sunday, was advised that when he was seen on Tuesday he was advised that he had an ear infection but is still having muscle aches, is worse on his legs. Has the patient traveled out of the country within the last 30 days? ---No Does the patient require triage? ---Yes Related visit to physician within the last 2 weeks? ---Yes Does the PT have any chronic conditions? (i.e. diabetes, asthma, etc.) ---Yes List chronic conditions. ---HTN Guidelines Guideline Title Affirmed Question Affirmed Notes Leg Pain [1] MODERATE pain (e.g., interferes with normal activities, limping) AND [2] present > 3 days Final Disposition User See PCP When Office is Open (within 3 days) Orvis Brillowland, Charity fundraiserN, [employer]Kathy

## 2014-11-10 NOTE — Telephone Encounter (Signed)
Pt has appt 11/11/2014 at 9:15 with Dr Alphonsus SiasLetvak.

## 2014-11-10 NOTE — Telephone Encounter (Signed)
i will need to check some blood work

## 2014-11-11 ENCOUNTER — Ambulatory Visit (INDEPENDENT_AMBULATORY_CARE_PROVIDER_SITE_OTHER): Payer: 59 | Admitting: Internal Medicine

## 2014-11-11 ENCOUNTER — Encounter: Payer: Self-pay | Admitting: *Deleted

## 2014-11-11 ENCOUNTER — Encounter: Payer: Self-pay | Admitting: Internal Medicine

## 2014-11-11 VITALS — BP 130/90 | HR 98 | Temp 98.2°F | Wt 246.0 lb

## 2014-11-11 DIAGNOSIS — M791 Myalgia, unspecified site: Secondary | ICD-10-CM | POA: Insufficient documentation

## 2014-11-11 LAB — COMPREHENSIVE METABOLIC PANEL
ALT: 203 U/L — AB (ref 0–53)
AST: 333 U/L — ABNORMAL HIGH (ref 0–37)
Albumin: 3.9 g/dL (ref 3.5–5.2)
Alkaline Phosphatase: 94 U/L (ref 39–117)
BILIRUBIN TOTAL: 0.9 mg/dL (ref 0.2–1.2)
BUN: 20 mg/dL (ref 6–23)
CHLORIDE: 101 meq/L (ref 96–112)
CO2: 26 meq/L (ref 19–32)
Calcium: 9.8 mg/dL (ref 8.4–10.5)
Creatinine, Ser: 1.34 mg/dL (ref 0.40–1.50)
GFR: 77.61 mL/min (ref >60.00)
GLUCOSE: 102 mg/dL — AB (ref 70–99)
POTASSIUM: 4.4 meq/L (ref 3.5–5.1)
SODIUM: 137 meq/L (ref 135–145)
TOTAL PROTEIN: 8.3 g/dL (ref 6.0–8.3)

## 2014-11-11 LAB — CBC WITH DIFFERENTIAL/PLATELET
BASOS ABS: 0 10*3/uL (ref 0.0–0.1)
Basophils Relative: 0.2 % (ref 0.0–3.0)
Eosinophils Absolute: 0.2 10*3/uL (ref 0.0–0.7)
Eosinophils Relative: 2.4 % (ref 0.0–5.0)
HEMATOCRIT: 49.1 % (ref 39.0–52.0)
Hemoglobin: 17 g/dL (ref 13.0–17.0)
LYMPHS ABS: 3.1 10*3/uL (ref 0.7–4.0)
Lymphocytes Relative: 33.5 % (ref 12.0–46.0)
MCHC: 34.7 g/dL (ref 30.0–36.0)
MCV: 81.7 fl (ref 78.0–100.0)
MONO ABS: 0.5 10*3/uL (ref 0.1–1.0)
Monocytes Relative: 4.9 % (ref 3.0–12.0)
NEUTROS PCT: 59 % (ref 43.0–77.0)
Neutro Abs: 5.5 10*3/uL (ref 1.4–7.7)
PLATELETS: 275 10*3/uL (ref 150.0–400.0)
RBC: 6 Mil/uL — ABNORMAL HIGH (ref 4.22–5.81)
RDW: 13.1 % (ref 11.5–15.5)
WBC: 9.4 10*3/uL (ref 4.0–10.5)

## 2014-11-11 LAB — SEDIMENTATION RATE: Sed Rate: 46 mm/hr — ABNORMAL HIGH (ref 0–22)

## 2014-11-11 LAB — CK: CK TOTAL: 9698 U/L — AB (ref 7–232)

## 2014-11-11 LAB — T4, FREE: Free T4: 1.13 ng/dL (ref 0.60–1.60)

## 2014-11-11 MED ORDER — CYCLOBENZAPRINE HCL 10 MG PO TABS: 5.0000 mg | ORAL_TABLET | Freq: Every evening | ORAL | Status: DC | PRN

## 2014-11-11 NOTE — Assessment & Plan Note (Signed)
Fairly severe but brief likely post infectious but will check some labs Will try cyclobenzaprine at bedtime for short term

## 2014-11-11 NOTE — Progress Notes (Signed)
   Subjective:    Patient ID: Scott Hodge, male    DOB: 1980-10-30, 34 y.o.   MRN: [id number]  HPI Here with wife  Ear feels somewhat better--he feels that fluid is moving around now Does have appt for ENT  Worried about the muscle pain Mentioned lupus---told him that is joints Then mentioned chronic fatigue syndrome-- and we discussed fibromyalgia But these symptoms only started in past week or so  He has missed work all this week due to muscle pain Doesn't sleep well chronically--he relates to time he was in prison Has tightness in legs and arms  Current Outpatient Prescriptions on File Prior to Visit  Medication Sig Dispense Refill  . amoxicillin-clavulanate (AUGMENTIN) 875-125 MG per tablet Take 1 tablet by mouth 2 (two) times daily. 42 tablet 0  . hydrochlorothiazide (HYDRODIURIL) 25 MG tablet Take 1 tablet (25 mg total) by mouth daily. 90 tablet 3   No current facility-administered medications on file prior to visit.    No Known Allergies  Past Medical History  Diagnosis Date  . Hypertension     No past surgical history on file.  Family History  Problem Relation Age of Onset  . Hypertension Mother   . Hypertension Father   . Cancer Paternal Aunt     died of unknown cancer  . Diabetes Paternal Uncle   . Heart disease Neg Hx     History   Social History  . Marital Status: Married    Spouse Name: N/A  . Number of Children: 2  . Years of Education: N/A   Occupational History  . Peer counselor     [employer]   Social History Main Topics  . Smoking status: Former Smoker -- 0.10 packs/day for 2 years    Types: Cigarettes  . Smokeless tobacco: Never Used     Comment: quit October 2015  . Alcohol Use: 0.0 oz/week    0 Standard drinks or equivalent per week     Comment: socially  . Drug Use: No  . Sexual Activity: Not on file   Other Topics Concern  . Not on file   Social History Narrative   2 step children   Review of Systems ?low grade  fever at night Appetite is off some No vomiting or diarrhea Weight may be down 2-3#    Objective:   Physical Exam  HENT:  TMs not inflamed but slightly retracted  Musculoskeletal:  No joint swelling anywhere   Neurological:  Mild muscle weakness legs>arms          Assessment & Plan:

## 2014-11-11 NOTE — Progress Notes (Signed)
Pre visit review using our clinic review tool, if applicable. No additional management support is needed unless otherwise documented below in the visit note. 

## 2014-11-12 ENCOUNTER — Encounter: Payer: Self-pay | Admitting: Internal Medicine

## 2014-11-12 ENCOUNTER — Other Ambulatory Visit: Payer: Self-pay | Admitting: Internal Medicine

## 2014-11-12 DIAGNOSIS — M791 Myalgia, unspecified site: Secondary | ICD-10-CM

## 2014-11-14 ENCOUNTER — Encounter: Payer: Self-pay | Admitting: Internal Medicine

## 2014-11-14 ENCOUNTER — Other Ambulatory Visit (INDEPENDENT_AMBULATORY_CARE_PROVIDER_SITE_OTHER): Payer: 59

## 2014-11-14 ENCOUNTER — Telehealth: Payer: Self-pay

## 2014-11-14 DIAGNOSIS — M791 Myalgia, unspecified site: Secondary | ICD-10-CM

## 2014-11-14 NOTE — Telephone Encounter (Signed)
Pt came to office to talk with Dr Alphonsus SiasLetvak about concerns pt has about HIV lab test and what this means; pt request cb and was arrived to have lab test while here.

## 2014-11-14 NOTE — Telephone Encounter (Signed)
I spoke to him Has viral myositis and it is routine to check this Reassured him that there was no specific indication of HIV infection

## 2014-11-15 ENCOUNTER — Encounter: Payer: Self-pay | Admitting: Internal Medicine

## 2014-11-15 LAB — HIV ANTIBODY (ROUTINE TESTING W REFLEX): HIV: NONREACTIVE

## 2014-11-21 ENCOUNTER — Encounter: Payer: Self-pay | Admitting: Internal Medicine

## 2014-11-22 ENCOUNTER — Encounter: Payer: Self-pay | Admitting: *Deleted

## 2015-09-19 ENCOUNTER — Other Ambulatory Visit: Payer: Self-pay | Admitting: Internal Medicine

## 2015-09-19 ENCOUNTER — Encounter: Payer: 59 | Admitting: Internal Medicine

## 2015-10-29 ENCOUNTER — Encounter (HOSPITAL_COMMUNITY): Payer: Self-pay | Admitting: Emergency Medicine

## 2015-10-29 ENCOUNTER — Emergency Department (HOSPITAL_COMMUNITY): Payer: Self-pay

## 2015-10-29 ENCOUNTER — Emergency Department (HOSPITAL_COMMUNITY)
Admission: EM | Admit: 2015-10-29 | Discharge: 2015-10-30 | Disposition: A | Discharge status: Home / Self Care | Payer: Self-pay | Attending: Emergency Medicine | Admitting: Emergency Medicine

## 2015-10-29 DIAGNOSIS — Z87891 Personal history of nicotine dependence: Secondary | ICD-10-CM | POA: Insufficient documentation

## 2015-10-29 DIAGNOSIS — R197 Diarrhea, unspecified: Secondary | ICD-10-CM | POA: Insufficient documentation

## 2015-10-29 DIAGNOSIS — J40 Bronchitis, not specified as acute or chronic: Secondary | ICD-10-CM | POA: Insufficient documentation

## 2015-10-29 DIAGNOSIS — I10 Essential (primary) hypertension: Secondary | ICD-10-CM | POA: Insufficient documentation

## 2015-10-29 DIAGNOSIS — J069 Acute upper respiratory infection, unspecified: Secondary | ICD-10-CM | POA: Insufficient documentation

## 2015-10-29 DIAGNOSIS — Z79899 Other long term (current) drug therapy: Secondary | ICD-10-CM | POA: Insufficient documentation

## 2015-10-29 MED ORDER — IPRATROPIUM-ALBUTEROL 0.5-2.5 (3) MG/3ML IN SOLN
3.0000 mL | Freq: Once | RESPIRATORY_TRACT | Status: AC
  Administered 2015-10-29: 3 mL via RESPIRATORY_TRACT
  Filled 2015-10-29: qty 3

## 2015-10-29 MED ORDER — IBUPROFEN 400 MG PO TABS
800.0000 mg | ORAL_TABLET | Freq: Once | ORAL | Status: AC
Start: 2015-10-29 — End: 2015-10-29
  Administered 2015-10-29: 800 mg via ORAL
  Filled 2015-10-29: qty 2

## 2015-10-29 NOTE — ED Notes (Signed)
See EDP assessment 

## 2015-10-29 NOTE — ED Provider Notes (Signed)
CSN: [id number]     Arrival date & time 10/29/15  2055 History  By signing my name below, I, Evon Slack, attest that this documentation has been prepared under the direction and in the presence of Danelle Berry, PA-C. Electronically Signed: Evon Slack, ED Scribe. 10/29/2015. 11:05 PM.      Chief Complaint  Patient presents with  . Headache  . Generalized Body Aches    Patient is a 35 y.o. male presenting with headaches. The history is provided by the patient. No language interpreter was used.  Headache Associated symptoms: cough, diarrhea and myalgias   Associated symptoms: no drainage, no ear pain, no fever, no nausea, no sore throat and no vomiting    HPI Comments: Scott Hodge is a 35 y.o. male with PMHX HTN who presents to the Emergency Department complaining of HA onset 2 days prior. Pt reports productive cough of yellow sputum, chest tightness, light headedness, myalgias, chills and sweats. He reports diarrhea x1 1 day prior. Pt states that his HA is worse when coughing. Pt meds attempted PTA. Denies fever, ear pain, sore throat, sinus pressure, nausea or vomiting. Pt does report recent sick contact.   Reports Hx of tobacco use   Past Medical History  Diagnosis Date  . Hypertension    History reviewed. No pertinent past surgical history. Family History  Problem Relation Age of Onset  . Hypertension Mother   . Hypertension Father   . Cancer Paternal Aunt     died of unknown cancer  . Diabetes Paternal Uncle   . Heart disease Neg Hx    Social History  Substance Use Topics  . Smoking status: Former Smoker -- 0.10 packs/day for 2 years    Types: Cigarettes  . Smokeless tobacco: Never Used     Comment: quit October 2015  . Alcohol Use: Yes    Review of Systems  Constitutional: Positive for chills. Negative for fever.  HENT: Negative for ear pain, postnasal drip and sore throat.   Respiratory: Positive for cough and chest tightness.   Gastrointestinal: Positive  for diarrhea. Negative for nausea and vomiting.  Musculoskeletal: Positive for myalgias.  Neurological: Positive for light-headedness and headaches.  All other systems reviewed and are negative.    Allergies  Review of patient's allergies indicates no known allergies.  Home Medications   Prior to Admission medications   Medication Sig Start Date End Date Taking? Authorizing Provider  amoxicillin-clavulanate (AUGMENTIN) 875-125 MG per tablet Take 1 tablet by mouth 2 (two) times daily. 11/08/14   Karie Schwalbe, MD  benzonatate (TESSALON) 100 MG capsule Take 1 capsule (100 mg total) by mouth every 8 (eight) hours. 11/01/15   Mady Gemma, PA-C  cyclobenzaprine (FLEXERIL) 10 MG tablet Take 0.5-1 tablets (5-10 mg total) by mouth at bedtime as needed for muscle spasms. 11/11/14   Karie Schwalbe, MD  guaiFENesin-codeine 100-10 MG/5ML syrup Take 5 mLs by mouth 3 (three) times daily as needed for cough. 10/30/15   Danelle Berry, PA-C  hydrochlorothiazide (HYDRODIURIL) 25 MG tablet TAKE 1 TABLET (25 MG TOTAL) BY MOUTH DAILY. 09/19/15   Karie Schwalbe, MD  ibuprofen (ADVIL,MOTRIN) 800 MG tablet Take 1 tablet (800 mg total) by mouth 3 (three) times daily. 11/01/15   Mady Gemma, PA-C  predniSONE (DELTASONE) 20 MG tablet Take 2 tablets (40 mg total) by mouth daily. 10/30/15   Danelle Berry, PA-C   BP 125/75 mmHg  Pulse 103  Temp(Src) 98.4 F (36.9 C) (Oral)  Resp  16  Ht  (1.702 m)  Wt 108.863 kg  BMI 37.58 kg/m2  SpO2 97%   Physical Exam  Constitutional: He is oriented to person, place, and time. He appears well-developed and well-nourished. No distress.  HENT:  Head: Normocephalic and atraumatic.  Right Ear: External ear normal.  Left Ear: External ear normal.  Nose: Nose normal.  Mouth/Throat: Oropharynx is clear and moist. No oropharyngeal exudate.  Eyes: Conjunctivae and EOM are normal. Pupils are equal, round, and reactive to light.  Neck: Normal range of motion. Neck  supple. No tracheal deviation present.  Cardiovascular: Normal rate, normal heart sounds and intact distal pulses.  Exam reveals no gallop and no friction rub.   No murmur heard. Pulmonary/Chest: Effort normal and breath sounds normal. No respiratory distress. He has no wheezes. He has no rales. He exhibits tenderness.  Abdominal: Soft. Bowel sounds are normal. He exhibits no distension. There is no tenderness. There is no rebound and no guarding.  Musculoskeletal: Normal range of motion.  Lymphadenopathy:    He has no cervical adenopathy.  Neurological: He is alert and oriented to person, place, and time. He exhibits normal muscle tone. Coordination normal.  Skin: Skin is warm and dry. No rash noted. He is not diaphoretic. No erythema. No pallor.  Psychiatric: He has a normal mood and affect. His behavior is normal.  Nursing note and vitals reviewed.   ED Course  Procedures (including critical care time) DIAGNOSTIC STUDIES: Oxygen Saturation is 97% on RA, normal by my interpretation.    COORDINATION OF CARE: 11:06 PM-Discussed treatment plan with pt at bedside and pt agreed to plan.     Labs Review Labs Reviewed - No data to display  Imaging Review   DG Chest 2 View (Final result) Result time: 10/30/15 00:13:17   Final result by Rad Results In Interface (10/30/15 00:13:17)   Narrative:   CLINICAL DATA: 35 year old male with cough and chest tightness  EXAM: CHEST 2 VIEW  COMPARISON: Radiograph dated 08/29/2011  FINDINGS: Two views of the chest demonstrate minimal increased interstitial prominence which may be related to atelectatic changes or mild congestion. There is no focal consolidation, pleural effusion, or pneumothorax. The cardiac silhouette is within normal limits. No acute osseous pathology.  IMPRESSION: Minimal interstitial prominence. No focal consolidation.   Electronically Signed By: Elgie Collard M.D. On: 10/30/2015 00:13       EKG  Interpretation None      MDM   Medications  ibuprofen (ADVIL,MOTRIN) tablet 800 mg (800 mg Oral Given 10/29/15 2311)  ipratropium-albuterol (DUONEB) 0.5-2.5 (3) MG/3ML nebulizer solution 3 mL (3 mLs Nebulization Given 10/29/15 2312)  predniSONE (DELTASONE) tablet 60 mg (60 mg Oral Given 10/30/15 0026)   Pt with URI/flu-like sx.  Endorsed chest tightness with cough.  CXR negative for PNA, significant for mild interstitial prominence, with hx of smoking, likely bronchitis.  Pt given breathing tx in ER, he did not note any improvement.   Pt is afebrile, without respiratory distress, non-toxic in appearance, VSS.  Pt likely has viral illness, d/c home with supportive treatment, cough syrup and short steroid burst for bronchitis.  He declined albuterol.   He was discharged in good condition. Filed Vitals:   10/29/15 2111  BP: 125/75  Pulse: 103  Temp: 98.4 F (36.9 C)  Resp: 16     Final diagnoses:  URI (upper respiratory infection)  Bronchitis   I personally performed the services described in this documentation, which was scribed in my presence. The  recorded information has been reviewed and is accurate.       Danelle BerryLeisa Zackry Deines, PA-C 11/02/15 1940  Rolland PorterMark James, MD 11/09/15 2259

## 2015-10-29 NOTE — ED Notes (Signed)
Pt states he has had a headache for the last 2 days and generalized body aches with a productive cough. Pt denies any chest pain or sob.

## 2015-10-30 MED ORDER — ALBUTEROL SULFATE HFA 108 (90 BASE) MCG/ACT IN AERS
2.0000 | INHALATION_SPRAY | RESPIRATORY_TRACT | Status: DC | PRN
  Administered 2015-10-30: 2 via RESPIRATORY_TRACT
  Filled 2015-10-30: qty 6.7

## 2015-10-30 MED ORDER — PREDNISONE 20 MG PO TABS
60.0000 mg | ORAL_TABLET | Freq: Once | ORAL | Status: AC
  Administered 2015-10-30: 60 mg via ORAL
  Filled 2015-10-30: qty 3

## 2015-10-30 MED ORDER — PREDNISONE 20 MG PO TABS: 40.0000 mg | ORAL_TABLET | Freq: Every day | ORAL | Status: DC

## 2015-10-30 MED ORDER — GUAIFENESIN-CODEINE 100-10 MG/5ML PO SOLN: 5.0000 mL | Freq: Three times a day (TID) | ORAL | Status: DC | PRN

## 2015-10-30 NOTE — Discharge Instructions (Signed)
How to Use an Inhaler Proper inhaler technique is very important. Good technique ensures that the medicine reaches the lungs. Poor technique results in depositing the medicine on the tongue and back of the throat rather than in the airways. If you do not use the inhaler with good technique, the medicine will not help you. STEPS TO FOLLOW IF USING AN INHALER WITHOUT AN EXTENSION TUBE  Remove the cap from the inhaler.  If you are using the inhaler for the first time, you will need to prime it. Shake the inhaler for 5 seconds and release four puffs into the air, away from your face. Ask your health care provider or pharmacist if you have questions about priming your inhaler.  Shake the inhaler for 5 seconds before each breath in (inhalation).  Position the inhaler so that the top of the canister faces up.  Put your index finger on the top of the medicine canister. Your thumb supports the bottom of the inhaler.  Open your mouth.  Either place the inhaler between your teeth and place your lips tightly around the mouthpiece, or hold the inhaler 1-2 inches away from your open mouth. If you are unsure of which technique to use, ask your health care provider.  Breathe out (exhale) normally and as completely as possible.  Press the canister down with your index finger to release the medicine.  At the same time as the canister is pressed, inhale deeply and slowly until your lungs are completely filled. This should take 4-6 seconds. Keep your tongue down.  Hold the medicine in your lungs for 5-10 seconds (10 seconds is best). This helps the medicine get into the small airways of your lungs.  Breathe out slowly, through pursed lips. Whistling is an example of pursed lips.  Wait at least 15-30 seconds between puffs. Continue with the above steps until you have taken the number of puffs your health care provider has ordered. Do not use the inhaler more than your health care provider tells  you.  Replace the cap on the inhaler.  Follow the directions from your health care provider or the inhaler insert for cleaning the inhaler. STEPS TO FOLLOW IF USING AN INHALER WITH AN EXTENSION (SPACER)  Remove the cap from the inhaler.  If you are using the inhaler for the first time, you will need to prime it. Shake the inhaler for 5 seconds and release four puffs into the air, away from your face. Ask your health care provider or pharmacist if you have questions about priming your inhaler.  Shake the inhaler for 5 seconds before each breath in (inhalation).  Place the open end of the spacer onto the mouthpiece of the inhaler.  Position the inhaler so that the top of the canister faces up and the spacer mouthpiece faces you.  Put your index finger on the top of the medicine canister. Your thumb supports the bottom of the inhaler and the spacer.  Breathe out (exhale) normally and as completely as possible.  Immediately after exhaling, place the spacer between your teeth and into your mouth. Close your lips tightly around the spacer.  Press the canister down with your index finger to release the medicine.  At the same time as the canister is pressed, inhale deeply and slowly until your lungs are completely filled. This should take 4-6 seconds. Keep your tongue down and out of the way.  Hold the medicine in your lungs for 5-10 seconds (10 seconds is best). This helps the  medicine get into the small airways of your lungs. Exhale.  Repeat inhaling deeply through the spacer mouthpiece. Again hold that breath for up to 10 seconds (10 seconds is best). Exhale slowly. If it is difficult to take this second deep breath through the spacer, breathe normally several times through the spacer. Remove the spacer from your mouth.  Wait at least 15-30 seconds between puffs. Continue with the above steps until you have taken the number of puffs your health care provider has ordered. Do not use the  inhaler more than your health care provider tells you.  Remove the spacer from the inhaler, and place the cap on the inhaler.  Follow the directions from your health care provider or the inhaler insert for cleaning the inhaler and spacer. If you are using different kinds of inhalers, use your quick relief medicine to open the airways 10-15 minutes before using a steroid if instructed to do so by your health care provider. If you are unsure which inhalers to use and the order of using them, ask your health care provider, nurse, or respiratory therapist. If you are using a steroid inhaler, always rinse your mouth with water after your last puff, then gargle and spit out the water. Do not swallow the water. AVOID:  Inhaling before or after starting the spray of medicine. It takes practice to coordinate your breathing with triggering the spray.  Inhaling through the nose (rather than the mouth) when triggering the spray. HOW TO DETERMINE IF YOUR INHALER IS FULL OR NEARLY EMPTY You cannot know when an inhaler is empty by shaking it. A few inhalers are now being made with dose counters. Ask your health care provider for a prescription that has a dose counter if you feel you need that extra help. If your inhaler does not have a counter, ask your health care provider to help you determine the date you need to refill your inhaler. Write the refill date on a calendar or your inhaler canister. Refill your inhaler 7-10 days before it runs out. Be sure to keep an adequate supply of medicine. This includes making sure it is not expired, and that you have a spare inhaler.  SEEK MEDICAL CARE IF:   Your symptoms are only partially relieved with your inhaler.  You are having trouble using your inhaler.  You have some increase in phlegm. SEEK IMMEDIATE MEDICAL CARE IF:   You feel little or no relief with your inhalers. You are still wheezing and are feeling shortness of breath or tightness in your chest or  both.  You have dizziness, headaches, or a fast heart rate.  You have chills, fever, or night sweats.  You have a noticeable increase in phlegm production, or there is blood in the phlegm. MAKE SURE YOU:   Understand these instructions.  Will watch your condition.  Will get help right away if you are not doing well or get worse.   This information is not intended to replace advice given to you by your health care provider. Make sure you discuss any questions you have with your health care provider.   Document Released: 07/19/2000 Document Revised: 05/12/2013 Document Reviewed: 02/18/2013 Elsevier Interactive Patient Education 2016 [employer]  Cough, Adult Coughing is a reflex that clears your throat and your airways. Coughing helps to heal and protect your lungs. It is normal to cough occasionally, but a cough that happens with other symptoms or lasts a long time may be a sign of a condition that  needs treatment. A cough may last only 2-3 weeks (acute), or it may last longer than 8 weeks (chronic). CAUSES Coughing is commonly caused by:  Breathing in substances that irritate your lungs.  A viral or bacterial respiratory infection.  Allergies.  Asthma.  Postnasal drip.  Smoking.  Acid backing up from the stomach into the esophagus (gastroesophageal reflux).  Certain medicines.  Chronic lung problems, including COPD (or rarely, lung cancer).  Other medical conditions such as heart failure. HOME CARE INSTRUCTIONS  Pay attention to any changes in your symptoms. Take these actions to help with your discomfort:  Take medicines only as told by your health care provider.  If you were prescribed an antibiotic medicine, take it as told by your health care provider. Do not stop taking the antibiotic even if you start to feel better.  Talk with your health care provider before you take a cough suppressant medicine.  Drink enough fluid to keep your urine clear or pale  yellow.  If the air is dry, use a cold steam vaporizer or humidifier in your bedroom or your home to help loosen secretions.  Avoid anything that causes you to cough at work or at home.  If your cough is worse at night, try sleeping in a semi-upright position.  Avoid cigarette smoke. If you smoke, quit smoking. If you need help quitting, ask your health care provider.  Avoid caffeine.  Avoid alcohol.  Rest as needed. SEEK MEDICAL CARE IF:   You have new symptoms.  You cough up pus.  Your cough does not get better after 2-3 weeks, or your cough gets worse.  You cannot control your cough with suppressant medicines and you are losing sleep.  You develop pain that is getting worse or pain that is not controlled with pain medicines.  You have a fever.  You have unexplained weight loss.  You have night sweats. SEEK IMMEDIATE MEDICAL CARE IF:  You cough up blood.  You have difficulty breathing.  Your heartbeat is very fast.   This information is not intended to replace advice given to you by your health care provider. Make sure you discuss any questions you have with your health care provider.   Document Released: 01/18/2011 Document Revised: 04/12/2015 Document Reviewed: 09/28/2014 Elsevier Interactive Patient Education 2016 [employer]  Viral Infections A viral infection can be caused by different types of viruses.Most viral infections are not serious and resolve on their own. However, some infections may cause severe symptoms and may lead to further complications. SYMPTOMS Viruses can frequently cause:  Minor sore throat.  Aches and pains.  Headaches.  Runny nose.  Different types of rashes.  Watery eyes.  Tiredness.  Cough.  Loss of appetite.  Gastrointestinal infections, resulting in nausea, vomiting, and diarrhea. These symptoms do not respond to antibiotics because the infection is not caused by bacteria. However, you might catch a bacterial  infection following the viral infection. This is sometimes called a "superinfection." Symptoms of such a bacterial infection may include:  Worsening sore throat with pus and difficulty swallowing.  Swollen neck glands.  Chills and a high or persistent fever.  Severe headache.  Tenderness over the sinuses.  Persistent overall ill feeling (malaise), muscle aches, and tiredness (fatigue).  Persistent cough.  Yellow, green, or brown mucus production with coughing. HOME CARE INSTRUCTIONS   Only take over-the-counter or prescription medicines for pain, discomfort, diarrhea, or fever as directed by your caregiver.  Drink enough water and fluids to keep your  urine clear or pale yellow. Sports drinks can provide valuable electrolytes, sugars, and hydration.  Get plenty of rest and maintain proper nutrition. Soups and broths with crackers or rice are fine. SEEK IMMEDIATE MEDICAL CARE IF:   You have severe headaches, shortness of breath, chest pain, neck pain, or an unusual rash.  You have uncontrolled vomiting, diarrhea, or you are unable to keep down fluids.  You or your child has an oral temperature above 102 F (38.9 C), not controlled by medicine.  Your baby is older than 3 months with a rectal temperature of 102 F (38.9 C) or higher.  Your baby is 663 months old or younger with a rectal temperature of 100.4 F (38 C) or higher. MAKE SURE YOU:   Understand these instructions.  Will watch your condition.  Will get help right away if you are not doing well or get worse.   This information is not intended to replace advice given to you by your health care provider. Make sure you discuss any questions you have with your health care provider.   Document Released: 05/01/2005 Document Revised: 10/14/2011 Document Reviewed: 12/28/2014 Elsevier Interactive Patient Education 2016 [employer]  Acute Bronchitis Bronchitis is inflammation of the airways that extend from the  windpipe into the lungs (bronchi). The inflammation often causes mucus to develop. This leads to a cough, which is the most common symptom of bronchitis.  In acute bronchitis, the condition usually develops suddenly and goes away over time, usually in a couple weeks. Smoking, allergies, and asthma can make bronchitis worse. Repeated episodes of bronchitis may cause further lung problems.  CAUSES Acute bronchitis is most often caused by the same virus that causes a cold. The virus can spread from person to person (contagious) through coughing, sneezing, and touching contaminated objects. SIGNS AND SYMPTOMS   Cough.   Fever.   Coughing up mucus.   Body aches.   Chest congestion.   Chills.   Shortness of breath.   Sore throat.  DIAGNOSIS  Acute bronchitis is usually diagnosed through a physical exam. Your health care provider will also ask you questions about your medical history. Tests, such as chest X-rays, are sometimes done to rule out other conditions.  TREATMENT  Acute bronchitis usually goes away in a couple weeks. Oftentimes, no medical treatment is necessary. Medicines are sometimes given for relief of fever or cough. Antibiotic medicines are usually not needed but may be prescribed in certain situations. In some cases, an inhaler may be recommended to help reduce shortness of breath and control the cough. A cool mist vaporizer may also be used to help thin bronchial secretions and make it easier to clear the chest.  HOME CARE INSTRUCTIONS  Get plenty of rest.   Drink enough fluids to keep your urine clear or pale yellow (unless you have a medical condition that requires fluid restriction). Increasing fluids may help thin your respiratory secretions (sputum) and reduce chest congestion, and it will prevent dehydration.   Take medicines only as directed by your health care provider.  If you were prescribed an antibiotic medicine, finish it all even if you start to feel  better.  Avoid smoking and secondhand smoke. Exposure to cigarette smoke or irritating chemicals will make bronchitis worse. If you are a smoker, consider using nicotine gum or skin patches to help control withdrawal symptoms. Quitting smoking will help your lungs heal faster.   Reduce the chances of another bout of acute bronchitis by washing your hands frequently,  avoiding people with cold symptoms, and trying not to touch your hands to your mouth, nose, or eyes.   Keep all follow-up visits as directed by your health care provider.  SEEK MEDICAL CARE IF: Your symptoms do not improve after 1 week of treatment.  SEEK IMMEDIATE MEDICAL CARE IF:  You develop an increased fever or chills.   You have chest pain.   You have severe shortness of breath.  You have bloody sputum.   You develop dehydration.  You faint or repeatedly feel like you are going to pass out.  You develop repeated vomiting.  You develop a severe headache. MAKE SURE YOU:   Understand these instructions.  Will watch your condition.  Will get help right away if you are not doing well or get worse.   This information is not intended to replace advice given to you by your health care provider. Make sure you discuss any questions you have with your health care provider.   Document Released: 08/29/2004 Document Revised: 08/12/2014 Document Reviewed: 01/12/2013 Elsevier Interactive Patient Education [employer]

## 2015-11-01 ENCOUNTER — Emergency Department (HOSPITAL_COMMUNITY)
Admission: EM | Admit: 2015-11-01 | Discharge: 2015-11-01 | Disposition: A | Discharge status: Home / Self Care | Payer: Self-pay | Attending: Emergency Medicine | Admitting: Emergency Medicine

## 2015-11-01 ENCOUNTER — Emergency Department (HOSPITAL_COMMUNITY): Payer: Self-pay

## 2015-11-01 ENCOUNTER — Encounter (HOSPITAL_COMMUNITY): Payer: Self-pay | Admitting: Emergency Medicine

## 2015-11-01 DIAGNOSIS — R0989 Other specified symptoms and signs involving the circulatory and respiratory systems: Secondary | ICD-10-CM

## 2015-11-01 DIAGNOSIS — Z792 Long term (current) use of antibiotics: Secondary | ICD-10-CM | POA: Insufficient documentation

## 2015-11-01 DIAGNOSIS — I10 Essential (primary) hypertension: Secondary | ICD-10-CM | POA: Insufficient documentation

## 2015-11-01 DIAGNOSIS — Z79899 Other long term (current) drug therapy: Secondary | ICD-10-CM | POA: Insufficient documentation

## 2015-11-01 DIAGNOSIS — J069 Acute upper respiratory infection, unspecified: Secondary | ICD-10-CM | POA: Insufficient documentation

## 2015-11-01 DIAGNOSIS — Z7952 Long term (current) use of systemic steroids: Secondary | ICD-10-CM | POA: Insufficient documentation

## 2015-11-01 DIAGNOSIS — R053 Chronic cough: Secondary | ICD-10-CM

## 2015-11-01 DIAGNOSIS — R05 Cough: Secondary | ICD-10-CM

## 2015-11-01 DIAGNOSIS — Z87891 Personal history of nicotine dependence: Secondary | ICD-10-CM | POA: Insufficient documentation

## 2015-11-01 DIAGNOSIS — R197 Diarrhea, unspecified: Secondary | ICD-10-CM | POA: Insufficient documentation

## 2015-11-01 MED ORDER — IBUPROFEN 400 MG PO TABS: 800.0000 mg | ORAL_TABLET | Freq: Once | ORAL | Status: DC

## 2015-11-01 MED ORDER — BENZONATATE 100 MG PO CAPS: 100.0000 mg | ORAL_CAPSULE | Freq: Three times a day (TID) | ORAL | Status: DC

## 2015-11-01 MED ORDER — IBUPROFEN 800 MG PO TABS: 800.0000 mg | ORAL_TABLET | Freq: Three times a day (TID) | ORAL | Status: DC

## 2015-11-01 NOTE — ED Provider Notes (Signed)
CSN: [id number]     Arrival date & time 11/01/15  2102 History  By signing my name below, I, Bethel Born, attest that this documentation has been prepared under the direction and in the presence of [employer]. Electronically Signed: Bethel Born, ED Scribe. 11/01/2015 10:13 PM   Chief Complaint  Patient presents with  . Cough  . Generalized Body Aches     The history is provided by the patient. No language interpreter was used.    Scott Hodge is a 35 y.o. male with history of HTN who presents to the Emergency Department complaining of a worsening productive cough with onset 3 days ago. Associated symptoms include generalized weakness, myalgias, chills, headache with coughing, sore throat with coughing, nasal congestion, and diarrhea (2 episodes today). He has been treating his symptoms with honey and lemon without sufficient relief. Pt was seen in the ED 2 days ago for the same symptoms. At that time he was prescribed a cough syrup but did not fill the prescription because he does not like the way that medication makes him feel. Pt denies fever, chest pain, SOB, abdominal pain, nausea, and vomiting. 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   Past Medical History  Diagnosis Date  . Hypertension    History reviewed. No pertinent past surgical history. Family History  Problem Relation Age of Onset  . Hypertension Mother   . Hypertension Father   . Cancer Paternal Aunt     died of unknown cancer  . Diabetes Paternal Uncle   . Heart disease Neg Hx    Social History  Substance Use Topics  . Smoking status: Former Smoker -- 0.10 packs/day for 2 years    Types: Cigarettes  . Smokeless tobacco: Never Used     Comment: quit October 2015  . Alcohol Use: Yes      Review of Systems  Constitutional: Positive for chills. Negative for fever.  HENT: Positive for congestion and sore throat. Negative for ear pain.   Respiratory:  Positive for cough. Negative for shortness of breath.   Cardiovascular: Negative for chest pain.  Gastrointestinal: Positive for diarrhea. Negative for nausea, vomiting and abdominal pain.  Musculoskeletal: Positive for myalgias.  Neurological: Positive for headaches.      Allergies  Review of patient's allergies indicates no known allergies.  Home Medications   Prior to Admission medications   Medication Sig Start Date End Date Taking? Authorizing Provider  amoxicillin-clavulanate (AUGMENTIN) 875-125 MG per tablet Take 1 tablet by mouth 2 (two) times daily. 11/08/14   Karie Schwalbe, MD  benzonatate (TESSALON) 100 MG capsule Take 1 capsule (100 mg total) by mouth every 8 (eight) hours. 11/01/15   Mady Gemma, PA-C  cyclobenzaprine (FLEXERIL) 10 MG tablet Take 0.5-1 tablets (5-10 mg total) by mouth at bedtime as needed for muscle spasms. 11/11/14   Karie Schwalbe, MD  guaiFENesin-codeine 100-10 MG/5ML syrup Take 5 mLs by mouth 3 (three) times daily as needed for cough. 10/30/15   Danelle Berry, PA-C  hydrochlorothiazide (HYDRODIURIL) 25 MG tablet TAKE 1 TABLET (25 MG TOTAL) BY MOUTH DAILY. 09/19/15   Karie Schwalbe, MD  ibuprofen (ADVIL,MOTRIN) 800 MG tablet Take 1 tablet (800 mg total) by mouth 3 (three) times daily. 11/01/15   Mady Gemma, PA-C  predniSONE (DELTASONE) 20 MG tablet Take 2 tablets (40 mg total) by mouth daily. 10/30/15   Danelle Berry, PA-C    BP  150/100 mmHg  Pulse 98  Temp(Src) 99.3 F (37.4 C) (Oral)  Resp 20  Ht  (1.702 m)  Wt 246 lb 6 oz (111.755 kg)  BMI 38.58 kg/m2  SpO2 97% Physical Exam  Constitutional: He is oriented to person, place, and time. He appears well-developed and well-nourished. No distress.  HENT:  Head: Normocephalic and atraumatic.  Right Ear: Hearing, tympanic membrane, external ear and ear canal normal.  Left Ear: Hearing, tympanic membrane, external ear and ear canal normal.  Nose: Nose normal.  Mouth/Throat: Uvula  is midline, oropharynx is clear and moist and mucous membranes are normal. No oropharyngeal exudate, posterior oropharyngeal edema, posterior oropharyngeal erythema or tonsillar abscesses.  Eyes: Conjunctivae, EOM and lids are normal. Pupils are equal, round, and reactive to light. Right eye exhibits no discharge. Left eye exhibits no discharge. No scleral icterus.  Neck: Normal range of motion. Neck supple.  Cardiovascular: Normal rate, regular rhythm, normal heart sounds, intact distal pulses and normal pulses.   Pulmonary/Chest: Effort normal and breath sounds normal. No respiratory distress. He has no wheezes. He has no rales.  Abdominal: Soft. Normal appearance and bowel sounds are normal. He exhibits no distension and no mass. There is no tenderness. There is no rigidity, no rebound and no guarding.  Musculoskeletal: Normal range of motion. He exhibits no edema or tenderness.  Neurological: He is alert and oriented to person, place, and time. He has normal strength. No cranial nerve deficit or sensory deficit.  Skin: Skin is warm, dry and intact. No rash noted. He is not diaphoretic. No erythema. No pallor.  Psychiatric: He has a normal mood and affect. His speech is normal and behavior is normal.  Nursing note and vitals reviewed.   ED Course  Procedures (including critical care time)  DIAGNOSTIC STUDIES: Oxygen Saturation is 97% on RA,  normal by my interpretation.    COORDINATION OF CARE: 10:08 PM Discussed treatment plan which includes CXR and ibuprofen with pt at bedside and pt agreed to plan.  Labs Review Labs Reviewed - No data to display  Imaging Review Dg Chest 2 View  11/01/2015  CLINICAL DATA:  Persistent cough and congestion. EXAM: CHEST  2 VIEW COMPARISON:  10/29/2015 chest radiograph. FINDINGS: Stable cardiomediastinal silhouette with normal heart size. No pneumothorax. No pleural effusion. No acute consolidative airspace disease. No pulmonary edema. IMPRESSION: No  active cardiopulmonary disease. Electronically Signed   By: Delbert Phenix M.D.   On: 11/01/2015 21:37   I have personally reviewed and evaluated these images as part of my medical decision-making.   EKG Interpretation None      MDM   Final diagnoses:  URI (upper respiratory infection)    35 year old male presents with chills, nasal congestion, and productive cough since Sunday. Also reports generalized body aches and fatigue. Notes headache and sore throat with coughing. States he had 2 episodes of loose stools today. Denies abdominal pain, nausea, vomiting.  Patient is afebrile. Vital signs stable. TMs clear bilaterally. No erythema, edema, or exudate to posterior oropharynx. Heart regular rate and rhythm. Lungs clear to auscultation bilaterally. Abdomen soft, nontender, nondistended. Normal neuro exam with no focal deficit. Patient ambulates without difficulty.   Chest x-ray ordered in triage, which is negative for active cardiopulmonary disease. Discussed findings with patient. Symptoms likely viral. Patient is nontoxic and well-appearing, feel he is stable for discharge at this time. We will give ibuprofen and tessalon for symptoms for home. Patient to follow up with PCP, [facility]  [facility]. Return precautions discussed. Patient verbalizes his understanding and is in agreement with plan.  BP 150/100 mmHg  Pulse 98  Temp(Src) 99.3 F (37.4 C) (Oral)  Resp 20  Ht 5\' 7"  (1.702 m)  Wt 111.755 kg  BMI 38.58 kg/m2  SpO2 97%  I personally performed the services described in this documentation, which was scribed in my presence. The recorded information has been reviewed and is accurate.      Mady Gemmalizabeth C Westfall, PA-C 11/01/15 2218  Pricilla LovelessScott Goldston, MD 11/07/15 1125

## 2015-11-01 NOTE — ED Notes (Signed)
Pt. reports persistent productive cough with nasal congestion / chest congestion , body aches and fatigue for several days , denies fever or chills , seen here 3 days ago prescribed with Prednisone and antitussive but did not fill prescription .

## 2015-11-01 NOTE — Discharge Instructions (Signed)
1. Medications: motrin, tessalon, usual home medications 2. Treatment: rest, drink plenty of fluids; try warm honey, tea, and throat lozenges for additional symptom relief 3. Follow Up: please followup with [facility] (go Thursday or Friday to walk in clinic) for discussion of your diagnoses and further evaluation after today's visit; if you do not have a primary care doctor use the phone number listed in your discharge paperwork to find one; please return to the ER for high fever, chest pain, shortness of breath, new or worsening symptoms    Upper Respiratory Infection, Adult Most upper respiratory infections (URIs) are caused by a virus. A URI affects the nose, throat, and upper air passages. The most common type of URI is often called "the common cold." HOME CARE   Take medicines only as told by your doctor.  Gargle warm saltwater or take cough drops to comfort your throat as told by your doctor.  Use a warm mist humidifier or inhale steam from a shower to increase air moisture. This may make it easier to breathe.  Drink enough fluid to keep your pee (urine) clear or pale yellow.  Eat soups and other clear broths.  Have a healthy diet.  Rest as needed.  Go back to work when your fever is gone or your doctor says it is okay.  You may need to stay home longer to avoid giving your URI to others.  You can also wear a face mask and wash your hands often to prevent spread of the virus.  Use your inhaler more if you have asthma.  Do not use any tobacco products, including cigarettes, chewing tobacco, or electronic cigarettes. If you need help quitting, ask your doctor. GET HELP IF:  You are getting worse, not better.  Your symptoms are not helped by medicine.  You have chills.  You are getting more short of breath.  You have brown or red mucus.  You have yellow or brown discharge from your nose.  You have pain in your face, especially when you bend  forward.  You have a fever.  You have puffy (swollen) neck glands.  You have pain while swallowing.  You have white areas in the back of your throat. GET HELP RIGHT AWAY IF:   You have very bad or constant:  Headache.  Ear pain.  Pain in your forehead, behind your eyes, and over your cheekbones (sinus pain).  Chest pain.  You have long-lasting (chronic) lung disease and any of the following:  Wheezing.  Long-lasting cough.  Coughing up blood.  A change in your usual mucus.  You have a stiff neck.  You have changes in your:  Vision.  Hearing.  Thinking.  Mood. MAKE SURE YOU:   Understand these instructions.  Will watch your condition.  Will get help right away if you are not doing well or get worse.   This information is not intended to replace advice given to you by your health care provider. Make sure you discuss any questions you have with your health care provider.   Document Released: 01/08/2008 Document Revised: 12/06/2014 Document Reviewed: 10/27/2013 Elsevier Interactive Patient Education [employer]2016 Elsevier Inc.

## 2015-11-02 ENCOUNTER — Telehealth: Payer: Self-pay

## 2015-11-02 NOTE — Telephone Encounter (Signed)
Spoke to patient. He said he is feeling a little better today.

## 2015-12-18 ENCOUNTER — Other Ambulatory Visit: Payer: Self-pay | Admitting: Internal Medicine

## 2016-01-08 ENCOUNTER — Other Ambulatory Visit: Payer: Self-pay | Admitting: Internal Medicine

## 2016-01-13 ENCOUNTER — Other Ambulatory Visit: Payer: Self-pay | Admitting: Internal Medicine

## 2016-01-15 NOTE — Telephone Encounter (Signed)
Rx sent electronically.  

## 2016-03-05 ENCOUNTER — Other Ambulatory Visit: Payer: Self-pay

## 2016-03-05 MED ORDER — HYDROCHLOROTHIAZIDE 25 MG PO TABS: ORAL_TABLET | ORAL | 2 refills | Status: DC

## 2016-03-05 NOTE — Telephone Encounter (Signed)
April scheduled CPX 05/10/16 and last seen 09/13/14. Refilled per protocol and pt will keep 05/2016 appt. April voiced understanding. [facility].

## 2016-05-10 ENCOUNTER — Encounter: Payer: Self-pay | Admitting: Internal Medicine

## 2016-05-10 DIAGNOSIS — Z0289 Encounter for other administrative examinations: Secondary | ICD-10-CM

## 2016-06-26 ENCOUNTER — Telehealth: Payer: Self-pay | Admitting: *Deleted

## 2016-06-26 ENCOUNTER — Encounter: Payer: Self-pay | Admitting: Internal Medicine

## 2016-06-26 NOTE — Telephone Encounter (Signed)
He really needs an appointment if we are going to refill any meds Okay to take him off schedule

## 2016-06-26 NOTE — Telephone Encounter (Signed)
PT r/s per provider

## 2016-06-26 NOTE — Telephone Encounter (Signed)
PT did not show up for his physical appointment. Please let me know if you would like me to contact him for a follow up or not. Also would you like me to remove him off the schedule?

## 2016-07-02 ENCOUNTER — Ambulatory Visit (INDEPENDENT_AMBULATORY_CARE_PROVIDER_SITE_OTHER): Payer: Managed Care, Other (non HMO) | Admitting: Internal Medicine

## 2016-07-02 ENCOUNTER — Encounter: Payer: Self-pay | Admitting: Internal Medicine

## 2016-07-02 VITALS — BP 132/94 | HR 80 | Temp 98.7°F | Wt 247.0 lb

## 2016-07-02 DIAGNOSIS — I10 Essential (primary) hypertension: Secondary | ICD-10-CM | POA: Diagnosis not present

## 2016-07-02 MED ORDER — HYDROCHLOROTHIAZIDE 25 MG PO TABS: ORAL_TABLET | ORAL | 3 refills | Status: DC

## 2016-07-02 NOTE — Progress Notes (Signed)
Pre visit review using our clinic review tool, if applicable. No additional management support is needed unless otherwise documented below in the visit note. 

## 2016-07-02 NOTE — Patient Instructions (Signed)
DASH Eating Plan DASH stands for "Dietary Approaches to Stop Hypertension." The DASH eating plan is a healthy eating plan that has been shown to reduce high blood pressure (hypertension). Additional health benefits may include reducing the risk of type 2 diabetes mellitus, heart disease, and stroke. The DASH eating plan may also help with weight loss. What do I need to know about the DASH eating plan? For the DASH eating plan, you will follow these general guidelines:  Choose foods with less than 150 milligrams of sodium per serving (as listed on the food label).  Use salt-free seasonings or herbs instead of table salt or sea salt.  Check with your health care provider or pharmacist before using salt substitutes.  Eat lower-sodium products. These are often labeled as "low-sodium" or "no salt added."  Eat fresh foods. Avoid eating a lot of canned foods.  Eat more vegetables, fruits, and low-fat dairy products.  Choose whole grains. Look for the word "whole" as the first word in the ingredient list.  Choose fish and skinless chicken or turkey more often than red meat. Limit fish, poultry, and meat to 6 oz (170 g) each day.  Limit sweets, desserts, sugars, and sugary drinks.  Choose heart-healthy fats.  Eat more home-cooked food and less restaurant, buffet, and fast food.  Limit fried foods.  Do not fry foods. Cook foods using methods such as baking, boiling, grilling, and broiling instead.  When eating at a restaurant, ask that your food be prepared with less salt, or no salt if possible. What foods can I eat? Seek help from a dietitian for individual calorie needs. Grains  Whole grain or whole wheat bread. Brown rice. Whole grain or whole wheat pasta. Quinoa, bulgur, and whole grain cereals. Low-sodium cereals. Corn or whole wheat flour tortillas. Whole grain cornbread. Whole grain crackers. Low-sodium crackers. Vegetables  Fresh or frozen vegetables (raw, steamed, roasted, or  grilled). Low-sodium or reduced-sodium tomato and vegetable juices. Low-sodium or reduced-sodium tomato sauce and paste. Low-sodium or reduced-sodium canned vegetables. Fruits  All fresh, canned (in natural juice), or frozen fruits. Meat and Other Protein Products  Ground beef (85% or leaner), grass-fed beef, or beef trimmed of fat. Skinless chicken or turkey. Ground chicken or turkey. Pork trimmed of fat. All fish and seafood. Eggs. Dried beans, peas, or lentils. Unsalted nuts and seeds. Unsalted canned beans. Dairy  Low-fat dairy products, such as skim or 1% milk, 2% or reduced-fat cheeses, low-fat ricotta or cottage cheese, or plain low-fat yogurt. Low-sodium or reduced-sodium cheeses. Fats and Oils  Tub margarines without trans fats. Light or reduced-fat mayonnaise and salad dressings (reduced sodium). Avocado. Safflower, olive, or canola oils. Natural peanut or almond butter. Other  Unsalted popcorn and pretzels. The items listed above may not be a complete list of recommended foods or beverages. Contact your dietitian for more options.  What foods are not recommended? Grains  White bread. White pasta. White rice. Refined cornbread. Bagels and croissants. Crackers that contain trans fat. Vegetables  Creamed or fried vegetables. Vegetables in a cheese sauce. Regular canned vegetables. Regular canned tomato sauce and paste. Regular tomato and vegetable juices. Fruits  Canned fruit in light or heavy syrup. Fruit juice. Meat and Other Protein Products  Fatty cuts of meat. Ribs, chicken wings, bacon, sausage, bologna, salami, chitterlings, fatback, hot dogs, bratwurst, and packaged luncheon meats. Salted nuts and seeds. Canned beans with salt. Dairy  Whole or 2% milk, cream, half-and-half, and cream cheese. Whole-fat or sweetened yogurt. Full-fat cheeses   or blue cheese. Nondairy creamers and whipped toppings. Processed cheese, cheese spreads, or cheese curds. Condiments  Onion and garlic  salt, seasoned salt, table salt, and sea salt. Canned and packaged gravies. Worcestershire sauce. Tartar sauce. Barbecue sauce. Teriyaki sauce. Soy sauce, including reduced sodium. Steak sauce. Fish sauce. Oyster sauce. Cocktail sauce. Horseradish. Ketchup and mustard. Meat flavorings and tenderizers. Bouillon cubes. Hot sauce. Tabasco sauce. Marinades. Taco seasonings. Relishes. Fats and Oils  Butter, stick margarine, lard, shortening, ghee, and bacon fat. Coconut, palm kernel, or palm oils. Regular salad dressings. Other  Pickles and olives. Salted popcorn and pretzels. The items listed above may not be a complete list of foods and beverages to avoid. Contact your dietitian for more information.  Where can I find more information? National Heart, Lung, and Blood Institute: www.nhlbi.nih.gov/health/health-topics/topics/dash/ This information is not intended to replace advice given to you by your health care provider. Make sure you discuss any questions you have with your health care provider. Document Released: 07/11/2011 Document Revised: 12/28/2015 Document Reviewed: 05/26/2013 Elsevier Interactive Patient Education  2017 Elsevier Inc.  

## 2016-07-02 NOTE — Assessment & Plan Note (Signed)
BP Readings from Last 3 Encounters:  07/02/16 (!) 132/94  11/01/15 150/100  10/29/15 125/75   suboptimal contorl He wants to work on lifestyle before any changes

## 2016-07-02 NOTE — Addendum Note (Signed)
Addended by: Alvina ChouWALSH, Pearson Reasons J on: 07/02/2016 03:11 PM   Modules accepted: Orders

## 2016-07-02 NOTE — Progress Notes (Signed)
   Subjective:    Patient ID: Scott Hodge, male    DOB: 06/06/1981, 35 y.o.   MRN: [id number]  HPI Here with wife for follow up of HTN Wants to have "everything checked" Now working out again--trying to get everything together  Did get over the myositis  Still on BP pill No chest pain No SOB or trouble with exercise tolerance No dizziness or syncope No edema  Some trouble voiding when sitting If bowels are slow--- there is a problem Voids okay when standing  Current Outpatient Prescriptions on File Prior to Visit  Medication Sig Dispense Refill  . hydrochlorothiazide (HYDRODIURIL) 25 MG tablet TAKE 1 TABLET (25 MG TOTAL) BY MOUTH DAILY. 30 tablet 2   No current facility-administered medications on file prior to visit.     No Known Allergies  Past Medical History:  Diagnosis Date  . Hypertension     No past surgical history on file.  Family History  Problem Relation Age of Onset  . Hypertension Mother   . Hypertension Father   . Cancer Paternal Aunt     died of unknown cancer  . Diabetes Paternal Uncle   . Heart disease Neg Hx     Social History   Social History  . Marital status: Married    Spouse name: N/A  . Number of children: 2  . Years of education: N/A   Occupational History  . Peer counselor     [employer]   Social History Main Topics  . Smoking status: Former Smoker    Packs/day: 0.10    Years: 2.00    Types: Cigarettes  . Smokeless tobacco: Never Used     Comment: quit October 2015  . Alcohol use Yes  . Drug use:     Types: Marijuana  . Sexual activity: Not on file   Other Topics Concern  . Not on file   Social History Narrative   2 step children   Review of Systems Ear is better Still drains some from right ear Has Neti pot--considering starting that Sleeps okay---wife notes apnea for up to 10 seconds.  Awakens energized--always gets up at 5AM Will have some evening fatigue    Objective:   Physical Exam    Constitutional: He appears well-developed and well-nourished. No distress.  HENT:  Mouth/Throat: Oropharynx is clear and moist. No oropharyngeal exudate.  Neck: Normal range of motion. Neck supple. No thyromegaly present.  Cardiovascular: Normal rate, regular rhythm, normal heart sounds and intact distal pulses.  Exam reveals no gallop.   No murmur heard. Pulmonary/Chest: Effort normal and breath sounds normal. No respiratory distress. He has no wheezes. He has no rales.  Abdominal: Soft. There is no tenderness.  Musculoskeletal: He exhibits no edema or tenderness.  Lymphadenopathy:    He has no cervical adenopathy.  Psychiatric: He has a normal mood and affect. His behavior is normal.          Assessment & Plan:

## 2016-08-14 ENCOUNTER — Other Ambulatory Visit: Payer: Self-pay | Admitting: Internal Medicine

## 2016-08-22 ENCOUNTER — Encounter: Payer: Self-pay | Admitting: Internal Medicine

## 2016-09-17 ENCOUNTER — Other Ambulatory Visit: Payer: Self-pay | Admitting: Internal Medicine

## 2016-09-30 ENCOUNTER — Ambulatory Visit (INDEPENDENT_AMBULATORY_CARE_PROVIDER_SITE_OTHER): Payer: Managed Care, Other (non HMO) | Admitting: Podiatry

## 2016-09-30 ENCOUNTER — Encounter: Payer: Self-pay | Admitting: Podiatry

## 2016-09-30 VITALS — Resp 16 | Ht 67.0 in | Wt 240.0 lb

## 2016-09-30 DIAGNOSIS — B351 Tinea unguium: Secondary | ICD-10-CM | POA: Diagnosis not present

## 2016-09-30 DIAGNOSIS — L309 Dermatitis, unspecified: Secondary | ICD-10-CM

## 2016-09-30 MED ORDER — TERBINAFINE HCL 250 MG PO TABS: 250.0000 mg | ORAL_TABLET | Freq: Every day | ORAL | 0 refills | Status: DC

## 2016-09-30 NOTE — Progress Notes (Signed)
Subjective:     Patient ID: Scott Hodge, male   DOB: 09-01-1980, 36 y.o.   MRN: [id number]  HPI patient states that he's had fungal infection of several years right over left that he's tried creams for without relief and that he has an ingrown toenail right big toe lateral border that's painful   Review of Systems  All other systems reviewed and are negative.      Objective:   Physical Exam  Constitutional: He is oriented to person, place, and time.  Cardiovascular: Intact distal pulses.   Musculoskeletal: Normal range of motion.  Neurological: He is oriented to person, place, and time.  Skin: Skin is warm.  Nursing note and vitals reviewed.  neurovascular status intact muscle strength adequate range of motion within normal limits with patient found to have white discoloration between the lesser digits bilateral localized in nature and is found to have some slight blistering of the left plantar arch which occurs at different tight. Right hallux lateral borders incurvated and painful when palpated     Assessment:     Mycotic skin infection with bilateral involvement and ingrown toenail deformity right hallux with pain when palpated    Plan:     H&P conditions reviewed and discussed. Today I went ahead and I'm placing this patient on Lamisil 250 mg daily for 2 months and I did give him instructions on continued blood work done and gave him a sheet for liver function studies. I then discussed changing topical Lamisil and also correction of the ingrown toenail which she make it done at one point in future. Patient will be seen back as needed for problem

## 2016-09-30 NOTE — Progress Notes (Signed)
   Subjective:    Patient ID: Scott Hodge, male    DOB: 09/11/1980, 36 y.o.   MRN: [id number]  HPI  Chief Complaint  Patient presents with  . Nail Problem    Right; Lateral side. Pt's girlfriend states that the side of the nail was swollen but she removed a piece of nail and the nail looks better.   . Tinea Pedis    BL; In between toes. Itching and peeling skin. Pt states that his feet are always moist. Pt has tried different athletes foot creams with no relief.   . Skin Lesion    Left; Pt states that the lesions look like pus pockets on the bottom of his feet. The areas hurt.        Review of Systems     Objective:   Physical Exam        Assessment & Plan:

## 2017-04-05 IMAGING — CR DG CHEST 2V
2 series · 2 of 2 positions shown · non-contrast
Comparison: Radiograph dated 08/29/2011

CLINICAL DATA: 34-year-old male with cough and chest tightness

EXAM:
CHEST  2 VIEW

[chest pa]
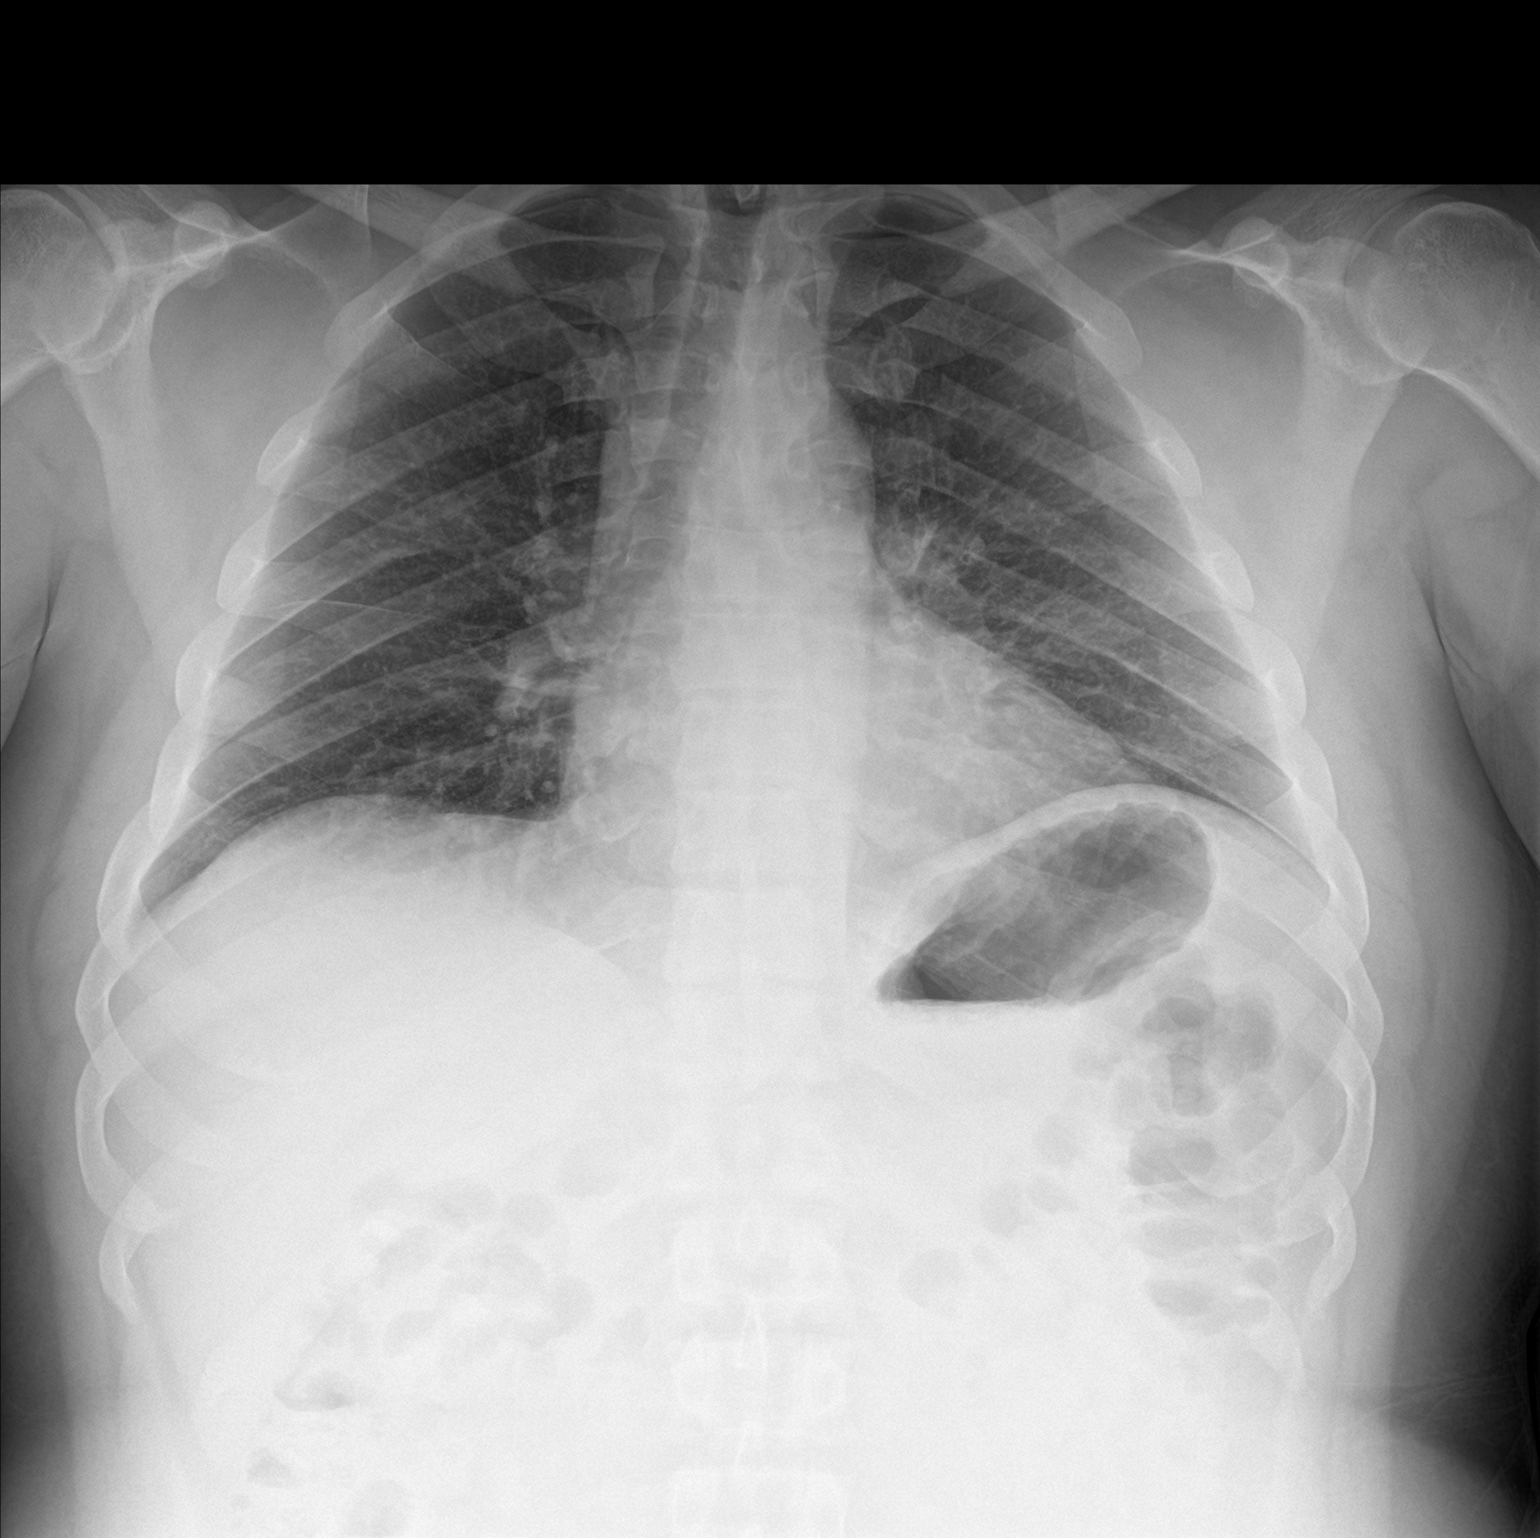

[chest lat]
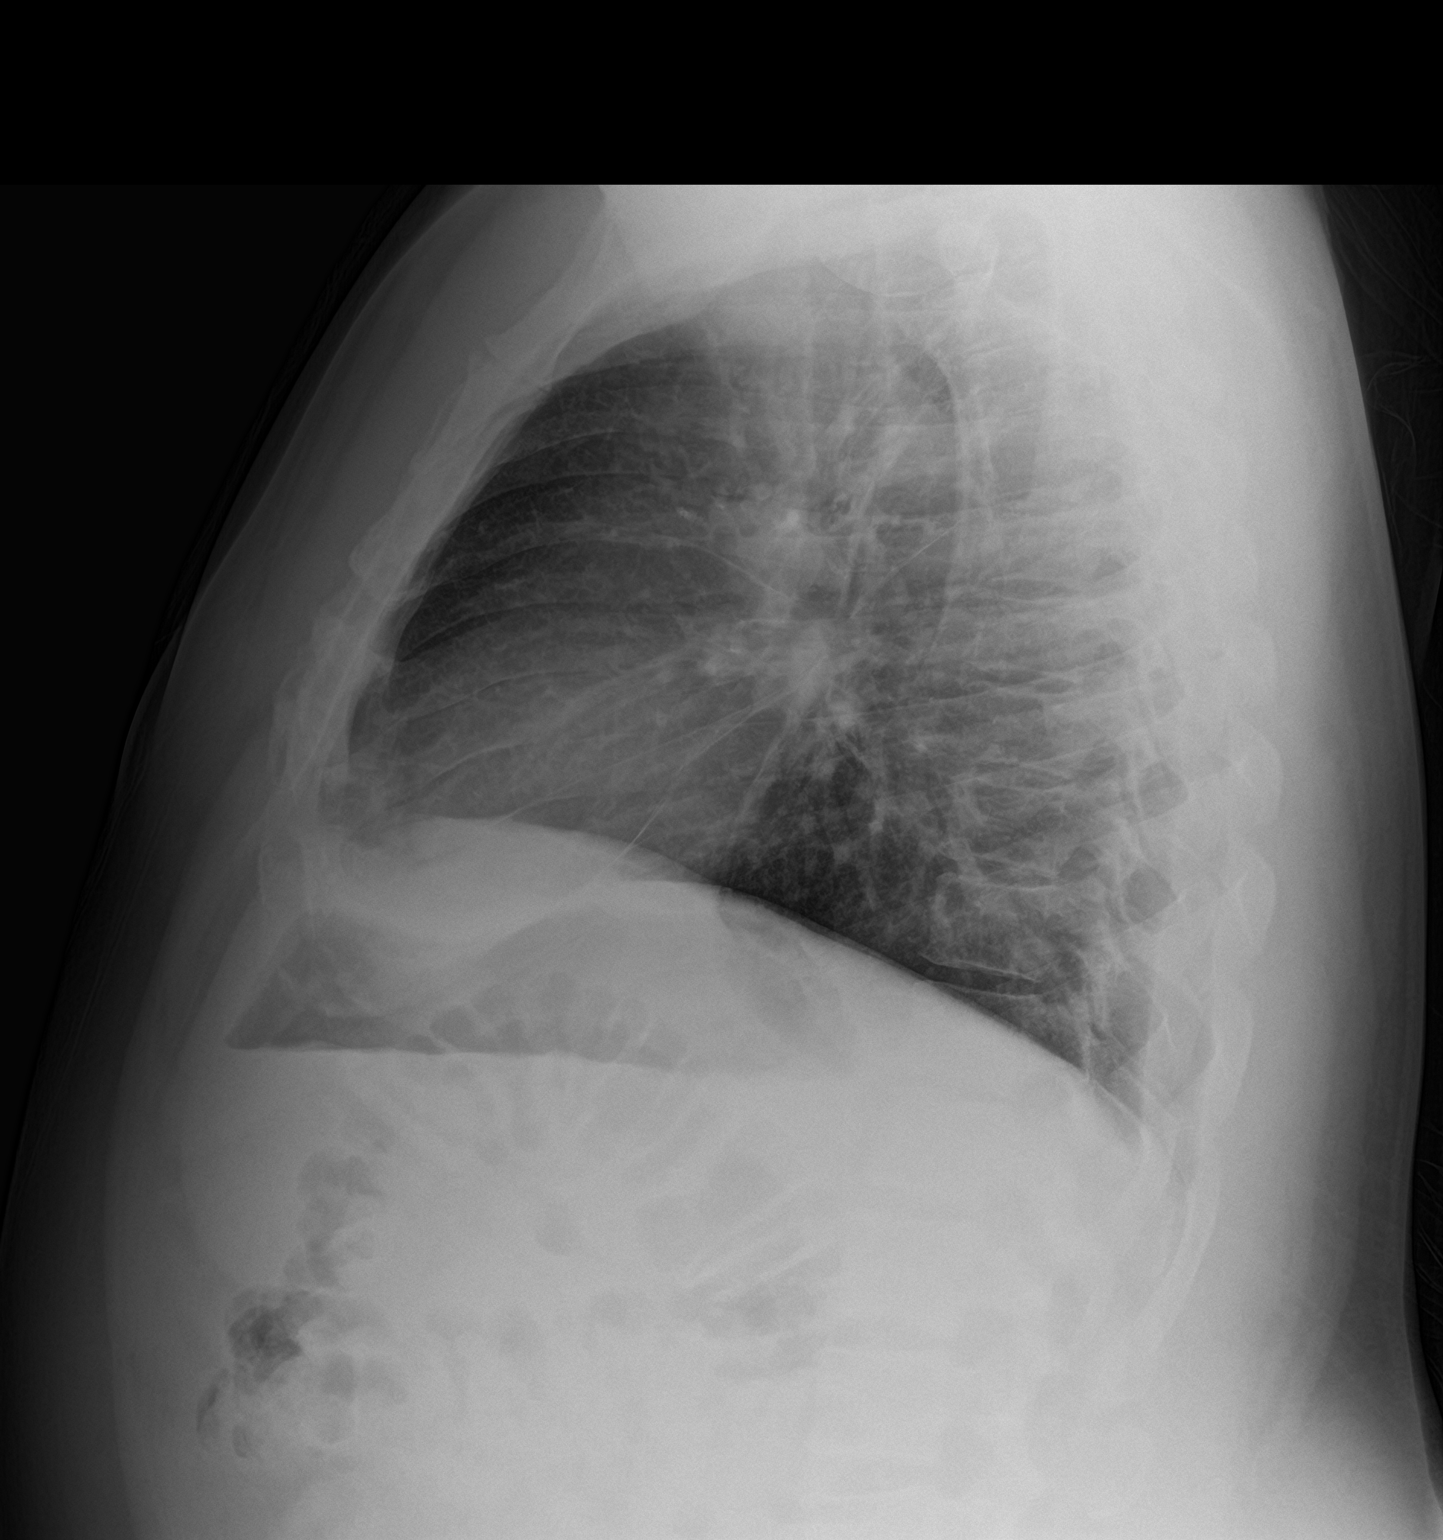

[2 of 2 positions shown; findings below may reference images not displayed]

FINDINGS: Two views of the chest demonstrate minimal increased interstitial
prominence which may be related to atelectatic changes or mild
congestion. There is no focal consolidation, pleural effusion, or
pneumothorax. The cardiac silhouette is within normal limits. No
acute osseous pathology.
IMPRESSION: Minimal interstitial prominence.  No focal consolidation.

## 2017-04-08 IMAGING — DX DG CHEST 2V
2 series · 2 of 2 positions shown · non-contrast
Comparison: 10/29/2015 chest radiograph.

CLINICAL DATA: Persistent cough and congestion.

EXAM:
CHEST  2 VIEW

[w chest pa]
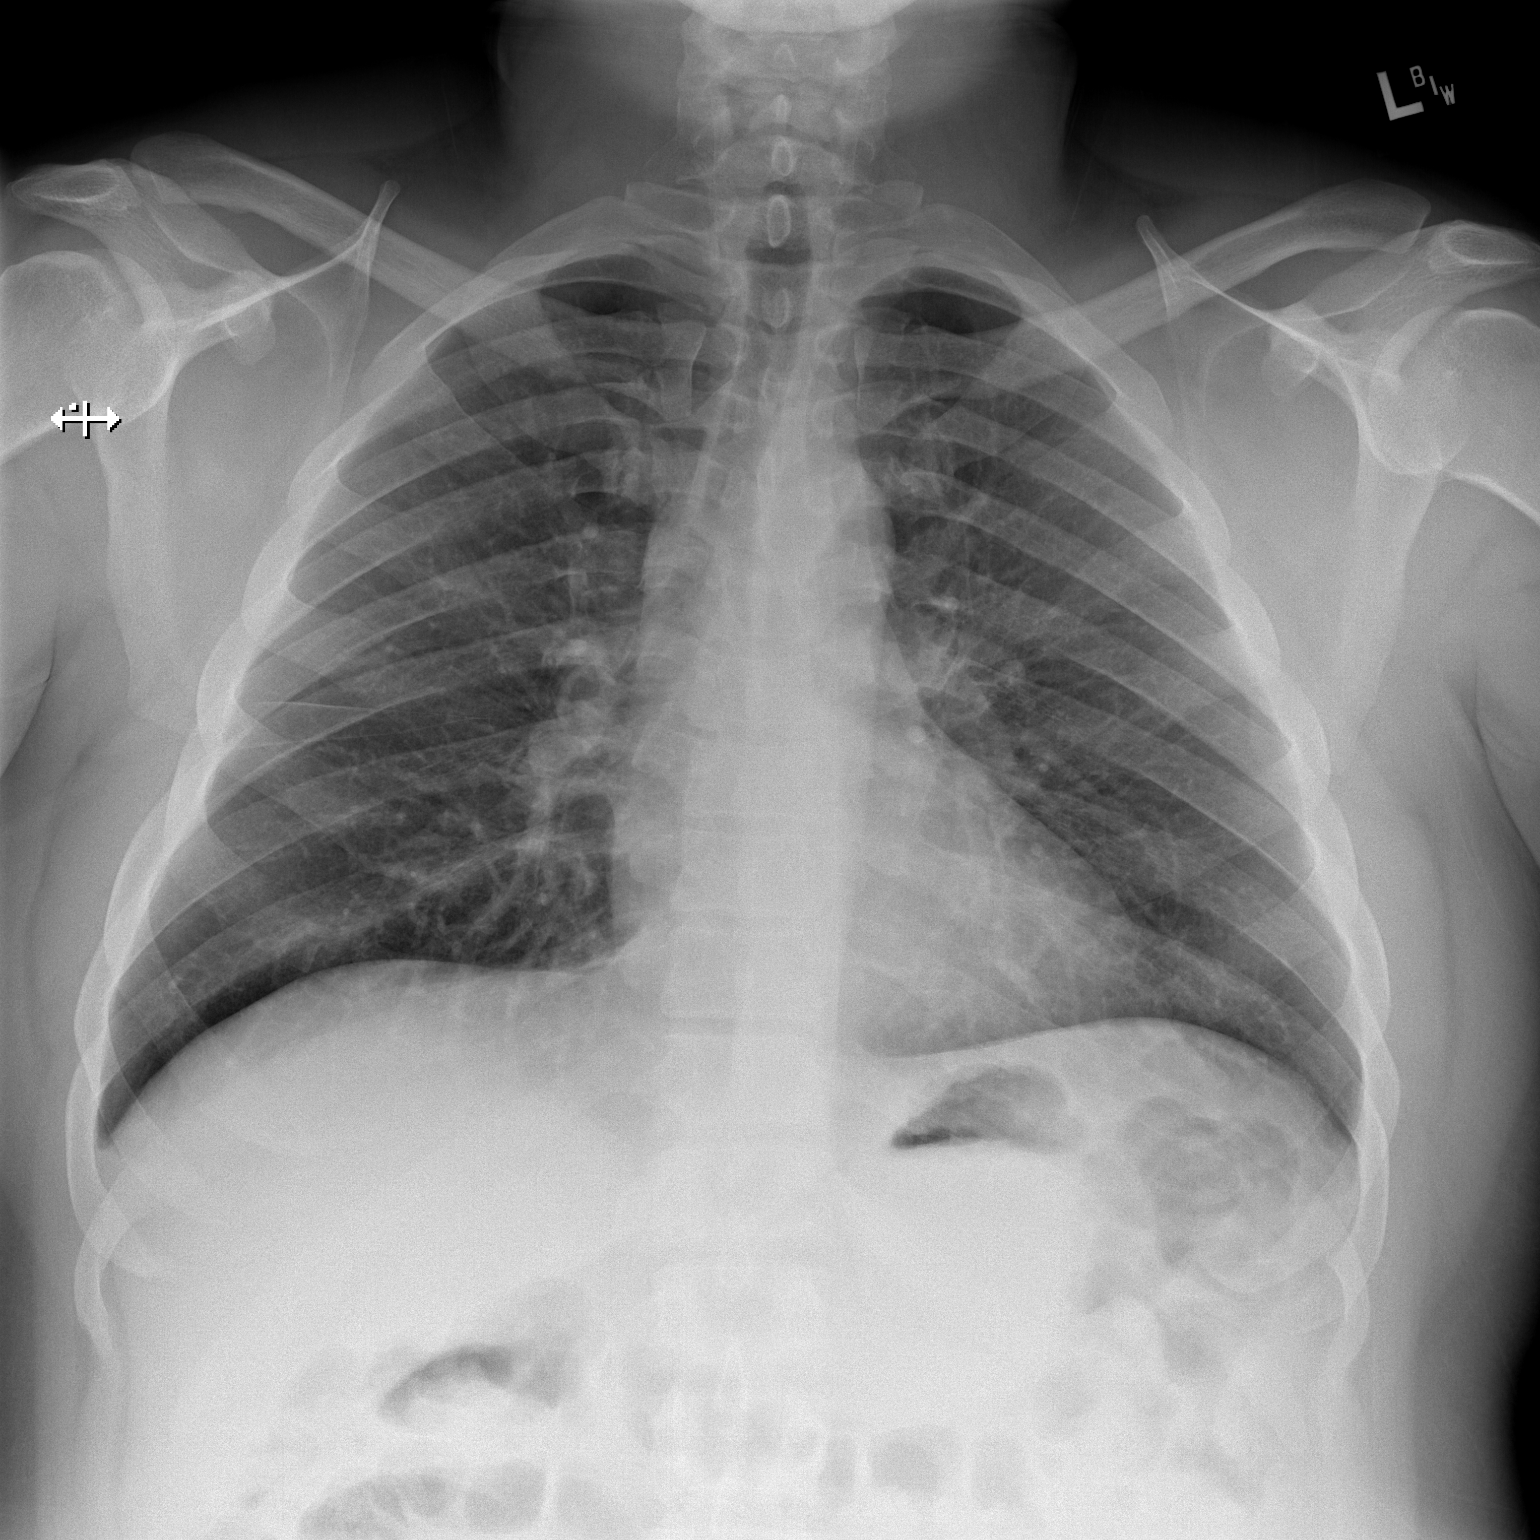

[w chest lat]
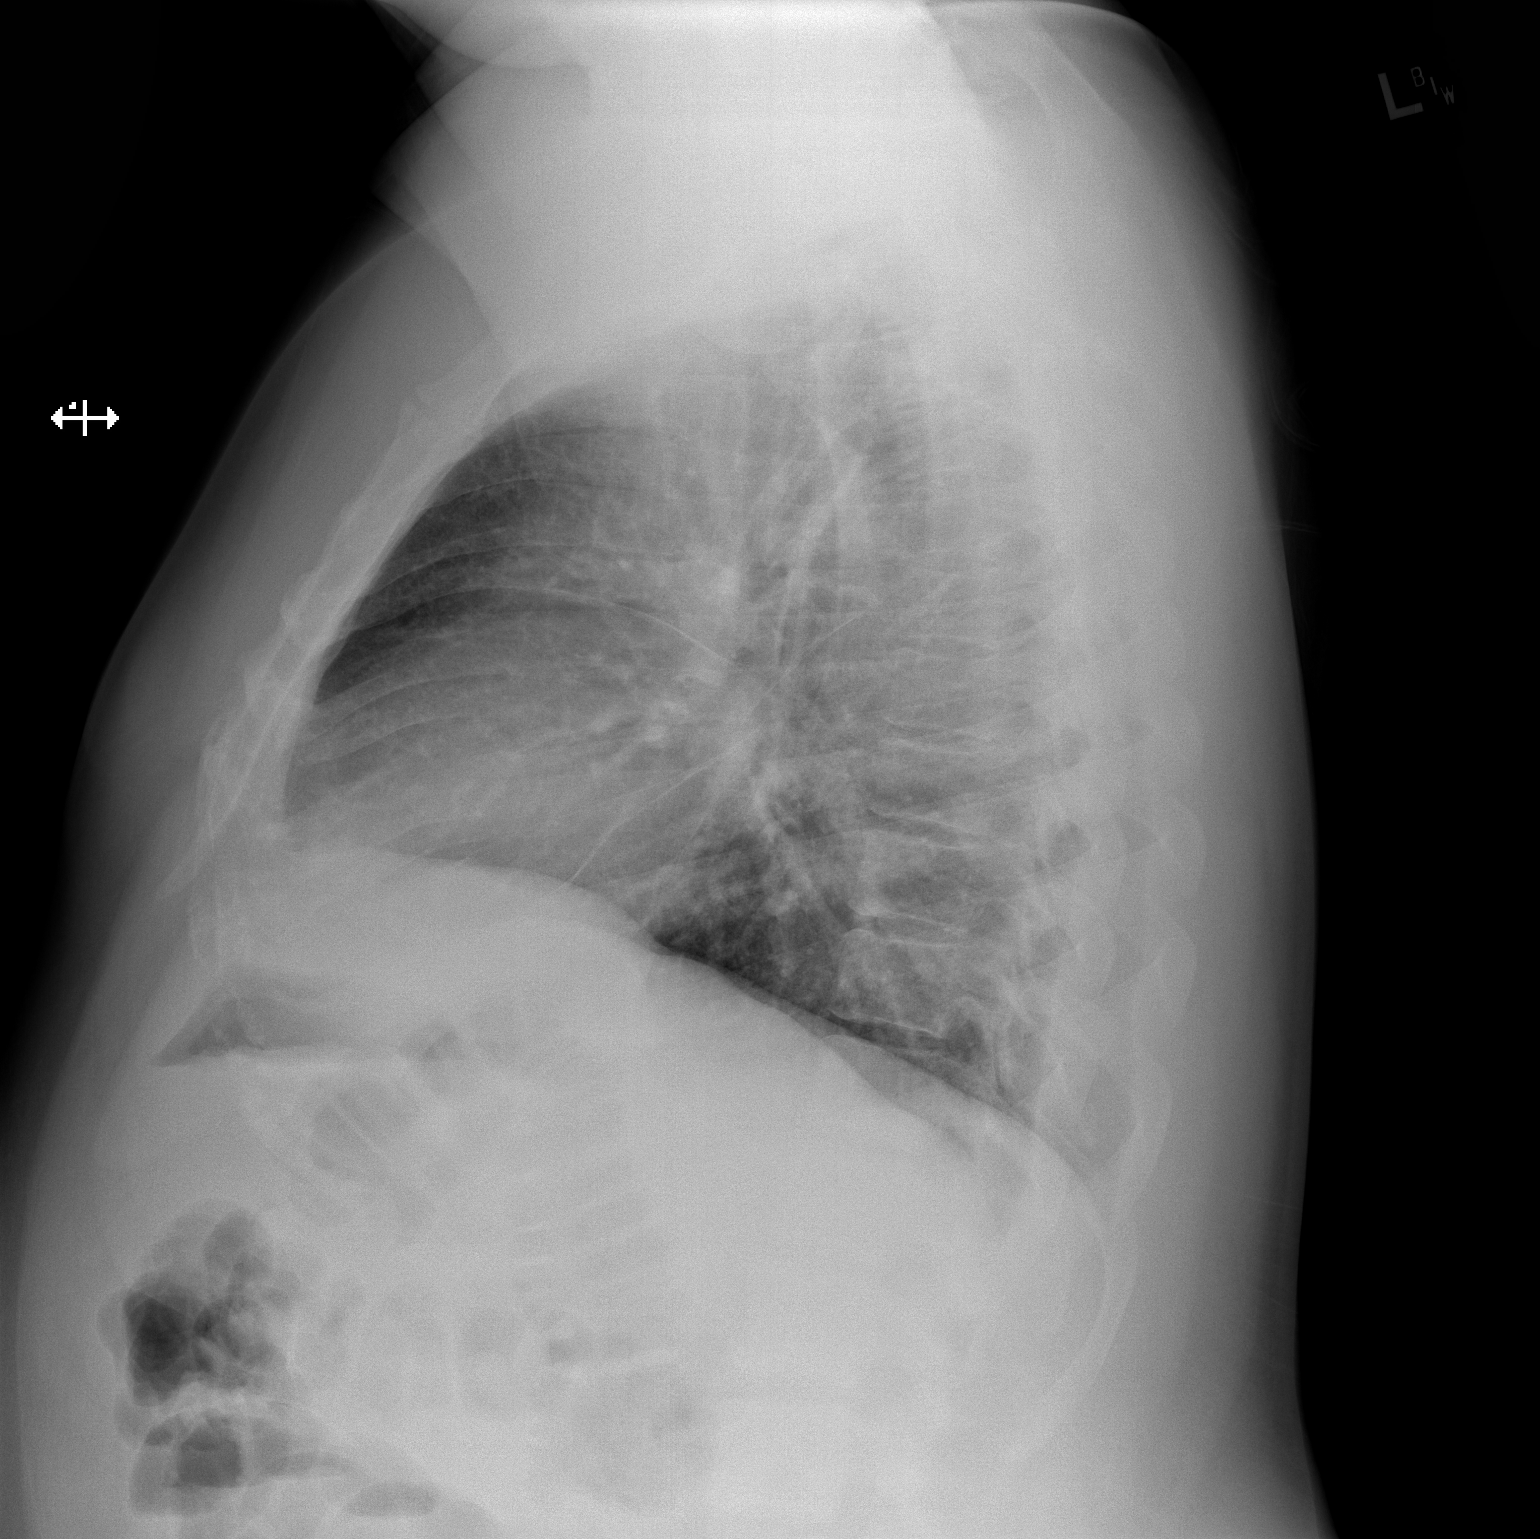

[2 of 2 positions shown; findings below may reference images not displayed]

FINDINGS: Stable cardiomediastinal silhouette with normal heart size. No
pneumothorax. No pleural effusion. No acute consolidative airspace
disease. No pulmonary edema.
IMPRESSION: No active cardiopulmonary disease.

## 2017-07-07 ENCOUNTER — Encounter: Payer: Self-pay | Admitting: Radiology

## 2017-08-25 ENCOUNTER — Telehealth: Payer: Self-pay | Admitting: Internal Medicine

## 2017-08-25 NOTE — Telephone Encounter (Signed)
Copied from CRM [phone]#[id number]. Topic: General - Other >> Aug 25, 2017  4:15 PM Raquel SarnaHayes, Teresa G wrote: Pt is down to 1 pill  - Hydrochlorothiazide - 25 mg  Pt is needing BP medication.  [place] of [place] and is now back needing to continue care with Dr. Alphonsus SiasLetvak.  He is needing his BP medication refilled.   [facility] - [address] - [phone]

## 2017-08-25 NOTE — Telephone Encounter (Signed)
Patient called at [phone], left VM to call and schedule a physical in order to get the BP medication refilled.

## 2017-08-26 NOTE — Telephone Encounter (Signed)
Patient called and stated he does not have insurance right now & he has to have his BP under control to get this new job, he would like a call back. [phone]

## 2017-08-27 MED ORDER — HYDROCHLOROTHIAZIDE 25 MG PO TABS: ORAL_TABLET | ORAL | 1 refills | Status: DC

## 2017-08-27 NOTE — Addendum Note (Signed)
Addended by: Eual FinesBRIDGES, SHANNON P on: 08/27/2017 10:09 AM   Modules accepted: Orders

## 2017-08-27 NOTE — Telephone Encounter (Signed)
Left a detailed message on vm per dpr. Rx sent to the [facility] in the system. The info in the message is incorrect for the pharmacy because it was a [facility] that closed.

## 2017-08-27 NOTE — Telephone Encounter (Signed)
Okay to refill the HCTZ for 6 months. If he has no insurance, a visit would be $80 and I may be able to cut that even more.

## 2017-09-02 ENCOUNTER — Telehealth: Payer: Self-pay | Admitting: *Deleted

## 2017-09-02 NOTE — Telephone Encounter (Signed)
Copied from CRM [phone]. Topic: Inquiry >> Sep 02, 2017  8:40 AM Yvonna Alanisobinson, Andra M wrote: Reason for CRM: Patient wants Dr. Alphonsus SiasLetvak know that he is truly grateful for his help in patient's medication situation.       Thank You!!!

## 2017-09-02 NOTE — Telephone Encounter (Signed)
Glad to help Hopefully he will get the new job and get insurance again

## 2018-06-25 ENCOUNTER — Other Ambulatory Visit: Payer: Self-pay | Admitting: Internal Medicine

## 2018-08-07 ENCOUNTER — Telehealth: Payer: Self-pay | Admitting: Internal Medicine

## 2018-08-07 NOTE — Telephone Encounter (Signed)
Pt's wife calling and stated pt is out of meds and want to know if he can get his refills since he did sched his physical for May. Please advise.

## 2018-08-07 NOTE — Telephone Encounter (Signed)
He has not been seen since 2017. Will have to let Dr Alphonsus Sias decide.

## 2018-08-07 NOTE — Telephone Encounter (Signed)
Okay to fill the HCTZ to get him through till the May appointment

## 2018-08-10 MED ORDER — HYDROCHLOROTHIAZIDE 25 MG PO TABS: ORAL_TABLET | ORAL | 4 refills | Status: DC

## 2018-08-10 NOTE — Telephone Encounter (Signed)
Rx sent electronically. Message left for wife

## 2018-08-11 ENCOUNTER — Telehealth: Payer: Self-pay | Admitting: Internal Medicine

## 2018-08-11 ENCOUNTER — Other Ambulatory Visit: Payer: Self-pay | Admitting: Internal Medicine

## 2018-08-11 NOTE — Telephone Encounter (Signed)
Pt's wife April (on dpr) called office stating the hydrochlorothiazide was sent to the wrong pharmacy. I did advise her to [facility]. But she wants [facility] [place] taken off the pt's chart completely because they don't use that pharmacy.

## 2018-08-11 NOTE — Telephone Encounter (Signed)
I corrected his pharmacy in the chart.

## 2018-12-07 ENCOUNTER — Encounter: Payer: Self-pay | Admitting: Internal Medicine

## 2019-04-06 ENCOUNTER — Other Ambulatory Visit: Payer: Self-pay | Admitting: Internal Medicine

## 2019-04-07 ENCOUNTER — Ambulatory Visit (INDEPENDENT_AMBULATORY_CARE_PROVIDER_SITE_OTHER): Payer: 59 | Admitting: Internal Medicine

## 2019-04-07 ENCOUNTER — Other Ambulatory Visit: Payer: Self-pay

## 2019-04-07 ENCOUNTER — Encounter: Payer: Self-pay | Admitting: Internal Medicine

## 2019-04-07 DIAGNOSIS — I10 Essential (primary) hypertension: Secondary | ICD-10-CM | POA: Diagnosis not present

## 2019-04-07 MED ORDER — HYDROCHLOROTHIAZIDE 25 MG PO TABS
ORAL_TABLET | ORAL | 1 refills | Status: DC
Start: 2019-04-07 — End: 2020-01-17

## 2019-04-07 NOTE — Assessment & Plan Note (Signed)
BP Readings from Last 3 Encounters:  04/07/19 118/84  07/02/16 (!) 132/94  11/01/15 150/100   Good control Overdue for labs Will try to make in person visit soon

## 2019-04-07 NOTE — Progress Notes (Signed)
   Subjective:    Patient ID: Scott Hodge, male    DOB: 1981/02/26, 38 y.o.   MRN: [id number]  HPI Virtual visit for review of HTN Identification done Reviewed billing and he gave consent He is at home and I am in my office  Doing well Still working Still peer counseling ---at a [employer]  Has been checking BP intermittently Only up when getting teeth pulled at the dentist No chest pain No SOB No dizziness or syncope No edema  Current Outpatient Medications on File Prior to Visit  Medication Sig Dispense Refill  . hydrochlorothiazide (HYDRODIURIL) 25 MG tablet TAKE 1 TABLET(25 MG) BY MOUTH DAILY 30 tablet 4   No current facility-administered medications on file prior to visit.     No Known Allergies  Past Medical History:  Diagnosis Date  . Hypertension     History reviewed. No pertinent surgical history.  Family History  Problem Relation Age of Onset  . Hypertension Mother   . Hypertension Father   . Cancer Paternal Aunt        died of unknown cancer  . Diabetes Paternal Uncle   . Heart disease Neg Hx     Social History   Socioeconomic History  . Marital status: Married    Spouse name: Not on file  . Number of children: 2  . Years of education: Not on file  . Highest education level: Not on file  Occupational History  . Occupation: Immunologist    Comment: [employer]  . Financial resource strain: Not on file  . Food insecurity    Worry: Not on file    Inability: Not on file  . Transportation needs    Medical: Not on file    Non-medical: Not on file  Tobacco Use  . Smoking status: Former Smoker    Packs/day: 0.10    Years: 2.00    Pack years: 0.20    Types: Cigarettes  . Smokeless tobacco: Never Used  . Tobacco comment: quit October 2015  Substance and Sexual Activity  . Alcohol use: Yes  . Drug use: Yes    Types: Marijuana  . Sexual activity: Not on file  Lifestyle  . Physical activity    Days per week:  Not on file    Minutes per session: Not on file  . Stress: Not on file  Relationships  . Social Herbalist on phone: Not on file    Gets together: Not on file    Attends religious service: Not on file    Active member of club or organization: Not on file    Attends meetings of clubs or organizations: Not on file    Relationship status: Not on file  . Intimate partner violence    Fear of current or ex partner: Not on file    Emotionally abused: Not on file    Physically abused: Not on file    Forced sexual activity: Not on file  Other Topics Concern  . Not on file  Social History Narrative   2 step children   Review of Systems Appetite is fine Weight down from last time Sleeps fine    Objective:   Physical Exam  Constitutional: He appears well-developed. No distress.  Respiratory: Effort normal. No respiratory distress.  Psychiatric: He has a normal mood and affect. His behavior is normal.           Assessment & Plan:

## 2019-05-26 ENCOUNTER — Ambulatory Visit (INDEPENDENT_AMBULATORY_CARE_PROVIDER_SITE_OTHER): Payer: 59 | Admitting: Internal Medicine

## 2019-05-26 ENCOUNTER — Other Ambulatory Visit: Payer: Self-pay

## 2019-05-26 ENCOUNTER — Encounter: Payer: Self-pay | Admitting: Internal Medicine

## 2019-05-26 VITALS — BP 124/90 | HR 76 | Temp 98.7°F | Ht 66.5 in | Wt 229.0 lb

## 2019-05-26 DIAGNOSIS — Z0000 Encounter for general adult medical examination without abnormal findings: Secondary | ICD-10-CM | POA: Diagnosis not present

## 2019-05-26 DIAGNOSIS — I10 Essential (primary) hypertension: Secondary | ICD-10-CM | POA: Diagnosis not present

## 2019-05-26 DIAGNOSIS — R002 Palpitations: Secondary | ICD-10-CM | POA: Insufficient documentation

## 2019-05-26 DIAGNOSIS — Z23 Encounter for immunization: Secondary | ICD-10-CM

## 2019-05-26 LAB — LIPID PANEL
Cholesterol: 159 mg/dL (ref 0–200)
HDL: 32.1 mg/dL — ABNORMAL LOW (ref >39.00)
LDL Cholesterol: 108 mg/dL — ABNORMAL HIGH (ref 0–99)
NonHDL: 127.13
Total CHOL/HDL Ratio: 5
Triglycerides: 95 mg/dL (ref 0.0–149.0)
VLDL: 19 mg/dL (ref 0.0–40.0)

## 2019-05-26 LAB — COMPREHENSIVE METABOLIC PANEL
ALT: 16 U/L (ref 0–53)
AST: 14 U/L (ref 0–37)
Albumin: 4.2 g/dL (ref 3.5–5.2)
Alkaline Phosphatase: 108 U/L (ref 39–117)
BUN: 18 mg/dL (ref 6–23)
CO2: 30 mEq/L (ref 19–32)
Calcium: 9.4 mg/dL (ref 8.4–10.5)
Chloride: 102 mEq/L (ref 96–112)
Creatinine, Ser: 1.54 mg/dL — ABNORMAL HIGH (ref 0.40–1.50)
GFR: 60.71 mL/min (ref >60.00)
Glucose, Bld: 98 mg/dL (ref 70–99)
Potassium: 3.6 mEq/L (ref 3.5–5.1)
Sodium: 139 mEq/L (ref 135–145)
Total Bilirubin: 0.6 mg/dL (ref 0.2–1.2)
Total Protein: 7.5 g/dL (ref 6.0–8.3)

## 2019-05-26 LAB — CBC
HCT: 43.3 % (ref 39.0–52.0)
Hemoglobin: 15 g/dL (ref 13.0–17.0)
MCHC: 34.7 g/dL (ref 30.0–36.0)
MCV: 85.2 fl (ref 78.0–100.0)
Platelets: 322 10*3/uL (ref 150.0–400.0)
RBC: 5.08 Mil/uL (ref 4.22–5.81)
RDW: 13.3 % (ref 11.5–15.5)
WBC: 9.9 10*3/uL (ref 4.0–10.5)

## 2019-05-26 LAB — T4, FREE: Free T4: 0.87 ng/dL (ref 0.60–1.60)

## 2019-05-26 NOTE — Assessment & Plan Note (Signed)
Healthy but needs to continue to work on fitness Will update Td Prefers no flu vaccine Too young for cancer screening

## 2019-05-26 NOTE — Assessment & Plan Note (Signed)
BP Readings from Last 3 Encounters:  05/26/19 124/90  04/07/19 118/84  07/02/16 (!) 132/94   Reasonable control

## 2019-05-26 NOTE — Assessment & Plan Note (Addendum)
Doesn't really sound pathologic Will check EKG---shows sinus brady at 55. Normal axis and intervals. No hypertrophy or ischemic changes. Normal EKG and no change from 11/24/11. His symptoms are probably just PACs in setting of bradycardia---reassured but will just check labs

## 2019-05-26 NOTE — Progress Notes (Signed)
Subjective:    Patient ID: Scott Hodge, male    DOB: 17-Sep-1980, 38 y.o.   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 Not very fast though Often happens in AM--not with activity Trying to walk regularly  Current Outpatient Medications on File Prior to Visit  Medication Sig Dispense Refill  . hydrochlorothiazide (HYDRODIURIL) 25 MG tablet TAKE 1 TABLET(25 MG) BY MOUTH DAILY 90 tablet 1   No current facility-administered medications on file prior to visit.     No Known Allergies  Past Medical History:  Diagnosis Date  . Hypertension     History reviewed. No pertinent surgical history.  Family History  Problem Relation Age of Onset  . Hypertension Mother   . Hypertension Father   . Cancer Paternal Aunt        died of unknown cancer  . Diabetes Paternal Uncle   . Heart disease Neg Hx     Social History   Socioeconomic History  . Marital status: Married    Spouse name: Not on file  . Number of children: 2  . Years of education: Not on file  . Highest education level: Not on file  Occupational History  . Occupation: Immunologist    Comment: [place] [place]  . Financial resource strain: Not on file  . Food insecurity    Worry: Not on file    Inability: Not on file  . Transportation needs    Medical: Not on file    Non-medical: Not on file  Tobacco Use  . Smoking status: Former Smoker    Packs/day: 0.10    Years: 2.00    Pack years: 0.20    Types: Cigarettes  . Smokeless tobacco: Never Used  . Tobacco comment: quit October 2015  Substance and Sexual Activity  . Alcohol use: Yes  . Drug use: Yes    Types: Marijuana  . Sexual activity: Not on file   Lifestyle  . Physical activity    Days per week: Not on file    Minutes per session: Not on file  . Stress: Not on file  Relationships  . Social Herbalist on phone: Not on file    Gets together: Not on file    Attends religious service: Not on file    Active member of club or organization: Not on file    Attends meetings of clubs or organizations: Not on file    Relationship status: Not on file  . Intimate partner violence    Fear of current or ex partner: Not on file    Emotionally abused: Not on file    Physically abused: Not on file    Forced sexual activity: Not on file  Other Topics Concern  . Not on file  Social History Narrative   2 step children   Review of Systems  Constitutional: Negative for fatigue.       Weight is down 20# over past 5 years Wears seat belt  HENT: Negative for dental problem, hearing loss and tinnitus.        Dry mouth--relates to smoking CBD. Discussed using oil instead if he wants this  Eyes: Negative for  visual disturbance.       No diplopia or unilateral vision loss  Respiratory: Negative for cough, chest tightness and shortness of breath.   Cardiovascular: Positive for palpitations. Negative for chest pain and leg swelling.  Gastrointestinal: Negative for abdominal pain, blood in stool and constipation.       No heartburn  Endocrine: Negative for polydipsia and polyuria.  Genitourinary: Negative for difficulty urinating and urgency.       No sig ED--but libido is down.  Is trying to conceive with wife  Musculoskeletal: Negative for arthralgias, back pain and joint swelling.  Skin:       occ face breakout No suspicious lesions  Allergic/Immunologic: Positive for environmental allergies. Negative for immunocompromised state.       Some rhinorrhea lately--no meds  Neurological: Positive for headaches. Negative for dizziness, syncope and light-headedness.  Hematological: Negative for adenopathy. Does not bruise/bleed easily.   Psychiatric/Behavioral: The patient is not nervous/anxious.        Did have a period of depression for about a year---better now Doesn't sleep much--but is rested       Objective:   Physical Exam  Constitutional: He is oriented to person, place, and time. He appears well-developed. No distress.  HENT:  Head: Normocephalic and atraumatic.  Right Ear: External ear normal.  Left Ear: External ear normal.  Mouth/Throat: Oropharynx is clear and moist. No oropharyngeal exudate.  Eyes: Pupils are equal, round, and reactive to light. Conjunctivae are normal.  Neck: No thyromegaly present.  Cardiovascular: Normal rate, regular rhythm, normal heart sounds and intact distal pulses. Exam reveals no gallop.  No murmur heard. Slow normal  Respiratory: Effort normal and breath sounds normal. No respiratory distress. He has no wheezes. He has no rales.  GI: Soft. There is no abdominal tenderness.  Musculoskeletal:        General: No tenderness or edema.  Lymphadenopathy:    He has no cervical adenopathy.  Neurological: He is alert and oriented to person, place, and time.  Skin: No rash noted. No erythema.  Psychiatric: He has a normal mood and affect. His behavior is normal.           Assessment & Plan:

## 2019-05-26 NOTE — Patient Instructions (Signed)

## 2019-05-26 NOTE — Addendum Note (Signed)
Addended by: Pilar Grammes on: 05/26/2019 09:50 AM   Modules accepted: Orders

## 2020-01-17 ENCOUNTER — Other Ambulatory Visit: Payer: Self-pay | Admitting: Internal Medicine

## 2020-05-30 ENCOUNTER — Other Ambulatory Visit: Payer: Self-pay

## 2020-05-30 ENCOUNTER — Ambulatory Visit (INDEPENDENT_AMBULATORY_CARE_PROVIDER_SITE_OTHER): Payer: 59 | Admitting: Internal Medicine

## 2020-05-30 ENCOUNTER — Encounter: Payer: Self-pay | Admitting: Internal Medicine

## 2020-05-30 VITALS — BP 138/88 | HR 76 | Temp 97.7°F | Ht 66.5 in | Wt 240.0 lb

## 2020-05-30 DIAGNOSIS — Z0000 Encounter for general adult medical examination without abnormal findings: Secondary | ICD-10-CM

## 2020-05-30 DIAGNOSIS — I10 Essential (primary) hypertension: Secondary | ICD-10-CM

## 2020-05-30 LAB — CBC
HCT: 41.3 % (ref 39.0–52.0)
Hemoglobin: 14.5 g/dL (ref 13.0–17.0)
MCHC: 35.2 g/dL (ref 30.0–36.0)
MCV: 83 fl (ref 78.0–100.0)
Platelets: 341 10*3/uL (ref 150.0–400.0)
RBC: 4.98 Mil/uL (ref 4.22–5.81)
RDW: 13.3 % (ref 11.5–15.5)
WBC: 9.6 10*3/uL (ref 4.0–10.5)

## 2020-05-30 LAB — COMPREHENSIVE METABOLIC PANEL
ALT: 16 U/L (ref 0–53)
AST: 13 U/L (ref 0–37)
Albumin: 3.8 g/dL (ref 3.5–5.2)
Alkaline Phosphatase: 100 U/L (ref 39–117)
BUN: 16 mg/dL (ref 6–23)
CO2: 24 mEq/L (ref 19–32)
Calcium: 9 mg/dL (ref 8.4–10.5)
Chloride: 105 mEq/L (ref 96–112)
Creatinine, Ser: 1.46 mg/dL (ref 0.40–1.50)
GFR: 59.39 mL/min — ABNORMAL LOW (ref >60.00)
Glucose, Bld: 95 mg/dL (ref 70–99)
Potassium: 3.7 mEq/L (ref 3.5–5.1)
Sodium: 137 mEq/L (ref 135–145)
Total Bilirubin: 0.4 mg/dL (ref 0.2–1.2)
Total Protein: 7.3 g/dL (ref 6.0–8.3)

## 2020-05-30 MED ORDER — HYDROCHLOROTHIAZIDE 25 MG PO TABS: 25.0000 mg | ORAL_TABLET | Freq: Every day | ORAL | 3 refills | Status: DC

## 2020-05-30 NOTE — Progress Notes (Signed)
Subjective:    Patient ID: Scott Hodge, male    DOB: 20-Apr-1981, 39 y.o.   MRN: [id number]  HPI Here for physical This visit occurred during the SARS-CoV-2 public health emergency.  Safety protocols were in place, including screening questions prior to the visit, additional usage of staff PPE, and extensive cleaning of exam room while observing appropriate contact time as indicated for disinfecting solutions.   Has concerns about his left great toenail Injured the toe about a month ago Part of it lifted up---he trimmed it (hangnail part) Then pus came out Now looks black---but no pain  Has gained his 20# back  New relationship and girlfriend pregnant Craving food now---baby due soon (this has been a factor) Is going to the gym--but slacked off some  Not taking the BP med consistently Just doing every other day He checks sporadically----never over 145 (usually checks when has tension headache in neck) Was taking honey for potency (might have affected his BP)  Current Outpatient Medications on File Prior to Visit  Medication Sig Dispense Refill  . hydrochlorothiazide (HYDRODIURIL) 25 MG tablet TAKE 1 TABLET(25 MG) BY MOUTH DAILY 90 tablet 1   No current facility-administered medications on file prior to visit.    No Known Allergies  Past Medical History:  Diagnosis Date  . Hypertension     History reviewed. No pertinent surgical history.  Family History  Problem Relation Age of Onset  . Hypertension Mother   . Hypertension Father   . Cancer Paternal Aunt        died of unknown cancer  . Diabetes Paternal Uncle   . Heart disease Neg Hx     Social History   Socioeconomic History  . Marital status: Legally Separated    Spouse name: Not on file  . Number of children: 2  . Years of education: Not on file  . Highest education level: Not on file  Occupational History  . Occupation: Dance movement psychotherapist    Comment: Economist  . 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 Smoking status: Former Smoker    Packs/day: 0.10    Years: 2.00    Pack years: 0.20    Types: Cigarettes  . Smokeless tobacco: Never Used  . Tobacco comment: quit October 2015  Substance and Sexual Activity  . Alcohol use: Yes  . Drug use: Yes    Types: Marijuana  . Sexual activity: Not on file  Other Topics Concern  . Not on file  Social History Narrative   Wife separated --she has 2 children   Social Determinants of Health   Financial Resource Strain:   . Difficulty of Paying Living Expenses: Not on file  Food Insecurity:   . Worried About Programme researcher, broadcasting/film/video in the Last Year: Not on file  . Ran Out of Food in the Last Year: Not on file  Transportation Needs:   . Lack of Transportation (Medical): Not on file  . Lack of Transportation (Non-Medical): Not on file  Physical Activity:   . Days of Exercise per Week: Not on file  . Minutes of Exercise per Session: Not on file  Stress:   . Feeling of Stress : Not on file  Social Connections:   . Frequency of Communication with Friends and Family: Not on file  . Frequency of Social Gatherings with Friends and Family: Not on file  . Attends Religious Services: Not on file  . Active Member  of Clubs or Organizations: Not on file  . Attends Banker Meetings: Not on file  . Marital Status: Not on file  Intimate Partner Violence:   . Fear of Current or Ex-Partner: Not on file  . Emotionally Abused: Not on file  . Physically Abused: Not on file  . Sexually Abused: Not on file   Review of Systems  Constitutional: Positive for unexpected weight change.       Tired at times---relates to expecting baby Wears seat belt  HENT: Negative for dental problem, hearing loss, tinnitus and trouble swallowing.        Mouth stays dry  Eyes: Negative for visual disturbance.       No diplopia or unilateral vision loss Recent Rx change  Respiratory: Negative for cough, chest tightness  and shortness of breath.   Cardiovascular: Negative for chest pain, palpitations and leg swelling.  Gastrointestinal: Negative for abdominal pain, blood in stool and constipation.       Rare heartburn---no meds  Endocrine: Positive for polydipsia. Negative for polyuria.  Genitourinary: Negative for difficulty urinating and urgency.       Some ED at times---likely from stress  Musculoskeletal: Negative for arthralgias, back pain and joint swelling.  Skin: Negative for rash.  Allergic/Immunologic: Positive for environmental allergies. Negative for immunocompromised state.       Some AM congestion---no meds  Neurological: Positive for headaches. Negative for dizziness, syncope and light-headedness.  Hematological: Negative for adenopathy. Does not bruise/bleed easily.       Cuts don't heal as quick  Psychiatric/Behavioral: Positive for dysphoric mood. Negative for sleep disturbance. The patient is not nervous/anxious.        Never sleeps that long Some afternoon fatigue (may nap) Had some depression---relationship and other life issues (feels it is better now)       Objective:   Physical Exam Constitutional:      Appearance: Normal appearance.  HENT:     Right Ear: Tympanic membrane, ear canal and external ear normal.     Left Ear: Tympanic membrane, ear canal and external ear normal.     Mouth/Throat:     Pharynx: No oropharyngeal exudate or posterior oropharyngeal erythema.  Eyes:     Conjunctiva/sclera: Conjunctivae normal.     Pupils: Pupils are equal, round, and reactive to light.  Cardiovascular:     Rate and Rhythm: Normal rate and regular rhythm.     Pulses: Normal pulses.     Heart sounds: No murmur heard.  No gallop.   Pulmonary:     Effort: Pulmonary effort is normal.     Breath sounds: Normal breath sounds. No wheezing or rales.  Abdominal:     Palpations: Abdomen is soft.     Tenderness: There is no abdominal tenderness.  Musculoskeletal:     Cervical back: Neck  supple.     Right lower leg: No edema.     Left lower leg: No edema.  Lymphadenopathy:     Cervical: No cervical adenopathy.  Skin:    General: Skin is warm.     Findings: No rash.  Neurological:     General: No focal deficit present.     Mental Status: He is alert and oriented to person, place, and time.  Psychiatric:        Mood and Affect: Mood normal.        Behavior: Behavior normal.            Assessment & Plan:

## 2020-05-30 NOTE — Assessment & Plan Note (Signed)
Using the medication every other day  BP Readings from Last 3 Encounters:  05/30/20 138/88  05/26/19 124/90  04/07/19 118/84   Discussed taking daily Will check labs

## 2020-05-30 NOTE — Assessment & Plan Note (Signed)
Healthy but out of shape Will work on fitness Defer cancer screening due to age Scott Hodge him to reconsider COVID booster Doesn't take flu vaccine

## 2020-08-23 ENCOUNTER — Other Ambulatory Visit: Payer: Self-pay | Admitting: Internal Medicine

## 2021-06-06 ENCOUNTER — Encounter: Payer: 59 | Admitting: Internal Medicine

## 2021-06-13 ENCOUNTER — Encounter: Payer: 59 | Admitting: Internal Medicine

## 2021-09-12 ENCOUNTER — Other Ambulatory Visit: Payer: Self-pay | Admitting: Internal Medicine

## 2021-10-15 ENCOUNTER — Other Ambulatory Visit: Payer: Self-pay | Admitting: Internal Medicine

## 2021-11-17 ENCOUNTER — Other Ambulatory Visit: Payer: Self-pay | Admitting: Internal Medicine

## 2021-11-26 ENCOUNTER — Ambulatory Visit: Payer: Self-pay | Admitting: Internal Medicine

## 2021-12-20 ENCOUNTER — Other Ambulatory Visit: Payer: Self-pay | Admitting: Internal Medicine

## 2021-12-20 NOTE — Telephone Encounter (Signed)
Pt has been made aware several times that he must have an OV to continue filling the medication. He was a No Show on 11-26-21. Because it is a BP med, we do not want him to go without medication. That is why I sent in #15.

## 2022-01-15 ENCOUNTER — Other Ambulatory Visit: Payer: Self-pay | Admitting: Internal Medicine

## 2022-02-02 ENCOUNTER — Other Ambulatory Visit: Payer: Self-pay | Admitting: Internal Medicine

## 2022-02-08 ENCOUNTER — Other Ambulatory Visit: Payer: Self-pay | Admitting: Internal Medicine

## 2022-02-13 ENCOUNTER — Ambulatory Visit (INDEPENDENT_AMBULATORY_CARE_PROVIDER_SITE_OTHER): Payer: Self-pay | Admitting: Internal Medicine

## 2022-02-13 ENCOUNTER — Encounter: Payer: Self-pay | Admitting: Internal Medicine

## 2022-02-13 DIAGNOSIS — I10 Essential (primary) hypertension: Secondary | ICD-10-CM

## 2022-02-13 MED ORDER — HYDROCHLOROTHIAZIDE 25 MG PO TABS: ORAL_TABLET | ORAL | 3 refills | Status: DC

## 2022-02-13 NOTE — Assessment & Plan Note (Signed)
BP Readings from Last 3 Encounters:  02/13/22 (!) 154/98  05/30/20 138/88  05/26/19 124/90   Elevated now--down from first checking in Lots of stress---child out of wedlock----he is now back with wife and cares for daughter Busy with business, etc DASH info given Will restart the HCTZ Recheck 1-2 months

## 2022-02-13 NOTE — Progress Notes (Signed)
Subjective:    Patient ID: Scott Hodge, male    DOB: 06/25/1981, 41 y.o.   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 MAKE OFFICE VISIT BEFORE NEXT REFILL 10 tablet 0   No current facility-administered medications on file prior to visit.    No Known Allergies  Past Medical History:  Diagnosis Date   Hypertension     History reviewed. No pertinent surgical history.  Family History  Problem Relation Age of Onset   Hypertension Mother    Hypertension Father    Alcoholism Father    Cancer Paternal Aunt        died of unknown cancer   Diabetes Paternal Uncle    Heart disease Neg Hx     Social History   Socioeconomic History   Marital status: Married    Spouse name: Not on file   Number of children: 1   Years of education: Not on file   Highest education level: Not on file  Occupational History   Occupation: Systems developer studio    Comment: Economist   Occupation: International aid/development worker    Comment: Surveyor, minerals part time   Occupation: Paediatric nurse school  Tobacco Use   Smoking status: Former    Packs/day: 0.10    Years: 2.00    Total pack years: 0.20    Types: Cigarettes   Smokeless tobacco: Never   Tobacco comments:    quit October 2015  Substance and Sexual Activity   Alcohol use: Yes   Drug use: Yes    Types: Marijuana   Sexual activity: Not on file  Other Topics Concern   Not on file  Social History Narrative   2 step children   Daughter born 06/2020   Social Determinants of Health    Financial Resource Strain: Not on file  Food Insecurity: Not on file  Transportation Needs: Not on file  Physical Activity: Not on file  Stress: Not on file  Social Connections: Not on file  Intimate Partner Violence: Not on file   Review of Systems Sleep is never great---not great pattern More active now--since out of prison---but still kept his weight on Avoids salt He feels a lot of pressure on him---like financially tough having to pay for dad's funeral     Objective:   Physical Exam Constitutional:      Appearance: Normal appearance.  Cardiovascular:     Rate and Rhythm: Normal rate and regular rhythm.     Heart sounds: No murmur heard.    No gallop.  Pulmonary:     Effort: Pulmonary effort is normal.     Breath sounds: Normal breath sounds. No wheezing or rales.  Musculoskeletal:     Cervical back: Neck supple.     Right lower leg: No edema.     Left lower leg: No edema.  Lymphadenopathy:     Cervical: No cervical adenopathy.  Neurological:     Mental Status: He is alert.  Assessment & Plan:

## 2022-02-13 NOTE — Patient Instructions (Signed)

## 2022-02-16 ENCOUNTER — Other Ambulatory Visit: Payer: Self-pay | Admitting: Internal Medicine

## 2022-02-22 ENCOUNTER — Other Ambulatory Visit: Payer: Self-pay | Admitting: Internal Medicine

## 2022-03-19 ENCOUNTER — Ambulatory Visit: Payer: Self-pay | Admitting: Internal Medicine

## 2023-03-13 ENCOUNTER — Other Ambulatory Visit: Payer: Self-pay | Admitting: Internal Medicine

## 2023-03-17 ENCOUNTER — Encounter: Payer: Self-pay | Admitting: Internal Medicine

## 2023-03-18 ENCOUNTER — Encounter: Payer: Self-pay | Admitting: Internal Medicine

## 2023-06-03 ENCOUNTER — Other Ambulatory Visit: Payer: Self-pay

## 2023-06-03 ENCOUNTER — Emergency Department (HOSPITAL_BASED_OUTPATIENT_CLINIC_OR_DEPARTMENT_OTHER)
Admission: EM | Admit: 2023-06-03 | Discharge: 2023-06-04 | Disposition: A | Discharge status: Home / Self Care | Payer: Self-pay | Attending: Emergency Medicine | Admitting: Emergency Medicine

## 2023-06-03 ENCOUNTER — Encounter (HOSPITAL_BASED_OUTPATIENT_CLINIC_OR_DEPARTMENT_OTHER): Payer: Self-pay

## 2023-06-03 DIAGNOSIS — H938X1 Other specified disorders of right ear: Secondary | ICD-10-CM

## 2023-06-03 DIAGNOSIS — Z87891 Personal history of nicotine dependence: Secondary | ICD-10-CM | POA: Insufficient documentation

## 2023-06-03 DIAGNOSIS — I10 Essential (primary) hypertension: Secondary | ICD-10-CM

## 2023-06-03 MED ORDER — AMOXICILLIN 500 MG PO CAPS
500.0000 mg | ORAL_CAPSULE | Freq: Once | ORAL | Status: AC
  Administered 2023-06-03: 500 mg via ORAL
  Filled 2023-06-03: qty 1

## 2023-06-03 MED ORDER — AMLODIPINE BESYLATE 5 MG PO TABS: 5.0000 mg | ORAL_TABLET | Freq: Every day | ORAL | 1 refills | Status: DC

## 2023-06-03 MED ORDER — AMLODIPINE BESYLATE 5 MG PO TABS
5.0000 mg | ORAL_TABLET | Freq: Once | ORAL | Status: AC
  Administered 2023-06-03: 5 mg via ORAL
  Filled 2023-06-03: qty 1

## 2023-06-03 MED ORDER — AMOXICILLIN 500 MG PO CAPS: 500.0000 mg | ORAL_CAPSULE | Freq: Three times a day (TID) | ORAL | 0 refills | Status: AC

## 2023-06-03 MED ORDER — ACETAMINOPHEN 500 MG PO TABS
1000.0000 mg | ORAL_TABLET | Freq: Once | ORAL | Status: AC
  Administered 2023-06-03: 1000 mg via ORAL
  Filled 2023-06-03: qty 2

## 2023-06-03 NOTE — ED Provider Notes (Signed)
[facility] EMERGENCY [facility] Emergency Department Provider Note MRN:  [id number]  Arrival date & time: 06/03/23     Chief Complaint   Ear Fullness   History of Present Illness   Scott Hodge is a 42 y.o. year-old male with a history of hypertension presenting to the ED with chief complaint of ear fullness.  Right ear fullness for the past month.  They are typically he gets an upper respiratory infection and then he has lingering right ear fullness.  Fullness a bit longer than normal this time, some mild discomfort.  No other symptoms.  Noticed that his blood pressure is elevated here in the emergency department.  Is prescribed HCTZ but does not take it as often as he should.  Review of Systems  A thorough review of systems was obtained and all systems are negative except as noted in the HPI and PMH.   Patient's Health History    Past Medical History:  Diagnosis Date   Hypertension     History reviewed. No pertinent surgical history.  Family History  Problem Relation Age of Onset   Hypertension Mother    Hypertension Father    Alcoholism Father    Cancer Paternal Aunt        died of unknown cancer   Diabetes Paternal Uncle    Heart disease Neg Hx     Social History   Socioeconomic History   Marital status: Married    Spouse name: Not on file   Number of children: 1   Years of education: Not on file   Highest education level: Not on file  Occupational History   Occupation: Systems developer studio    Comment: Economist   Occupation: International aid/development worker    Comment: Surveyor, minerals part time   Occupation: Paediatric nurse school  Tobacco Use   Smoking status: Former    Current packs/day: 0.10    Average packs/day: 0.1 packs/day for 2.0 years (0.2 ttl pk-yrs)    Types: Cigarettes   Smokeless tobacco: Never   Tobacco comments:    quit October 2015  Substance and Sexual Activity   Alcohol use: Yes   Drug use: Yes    Types: Marijuana   Sexual activity: Not on file  Other  Topics Concern   Not on file  Social History Narrative   2 step children   Daughter born 06/2020   Social Determinants of Health   Financial Resource Strain: Not on file  Food Insecurity: Not on file  Transportation Needs: Not on file  Physical Activity: Not on file  Stress: Not on file  Social Connections: Not on file  Intimate Partner Violence: Not on file     Physical Exam   Vitals:   06/03/23 2230 06/03/23 2317  BP: (!) 209/142 (!) 198/133  Pulse: 75   Resp:    Temp:    SpO2: 99%     CONSTITUTIONAL: Well-appearing, NAD NEURO/PSYCH:  Alert and oriented x 3, no focal deficits EYES:  eyes equal and reactive ENT/NECK:  no LAD, no JVD CARDIO: Regular rate, well-perfused, normal S1 and S2 PULM:  CTAB no wheezing or rhonchi GI/GU:  non-distended, non-tender MSK/SPINE:  No gross deformities, no edema SKIN:  no rash, atraumatic   *Additional and/or pertinent findings included in MDM below  Diagnostic and Interventional Summary    EKG Interpretation Date/Time:    Ventricular Rate:    PR Interval:    QRS Duration:    QT Interval:    QTC Calculation:   R  Axis:      Text Interpretation:         Labs Reviewed - No data to display  No orders to display    Medications  amLODipine (NORVASC) tablet 5 mg (5 mg Oral Given 06/03/23 2317)  amoxicillin (AMOXIL) capsule 500 mg (500 mg Oral Given 06/03/23 2316)  acetaminophen (TYLENOL) tablet 1,000 mg (1,000 mg Oral Given 06/03/23 2316)     Procedures  /  Critical Care Procedures  ED Course and Medical Decision Making  Initial Impression and Ddx The right TM is inflamed and there is some signs of purulence behind the TM suggestive of otitis media.  Having a mild headache but not having any numbness or weakness to the arms or legs, no chest pain, no blurry vision.  Hypertensive likely related to discomfort, medication noncompliance.  Past medical/surgical history that increases complexity of ED encounter:  Hypertension  Interpretation of Diagnostics Laboratory and/or imaging options to aid in the diagnosis/care of the patient were considered.  After careful history and physical examination, it was determined that there was no indication for diagnostics at this time.  Patient Reassessment and Ultimate Disposition/Management     After Tylenol, amlodipine patient is feeling better, blood pressure downtrending, appropriate for discharge.  Patient management required discussion with the following services or consulting groups:  None  Complexity of Problems Addressed Acute complicated illness or Injury  Additional Data Reviewed and Analyzed Further history obtained from: Further history from spouse/family member  Additional Factors Impacting ED Encounter Risk Prescriptions  Elmer Sow. Pilar Plate, MD [facility] Emergency Medicine [facility] [email or web address]  Final Clinical Impressions(s) / ED Diagnoses     ICD-10-CM   1. Sensation of fullness in right ear  H93.8X1     2. Hypertension, unspecified type  I10       ED Discharge Orders          Ordered    amLODipine (NORVASC) 5 MG tablet  Daily        06/03/23 2350    amoxicillin (AMOXIL) 500 MG capsule  3 times daily        06/03/23 2350             Discharge Instructions Discussed with and Provided to Patient:    Discharge Instructions      You were evaluated in the Emergency Department and after careful evaluation, we did not find any emergent condition requiring admission or further testing in the hospital.  Your exam/testing today is overall reassuring.  Symptoms may be due to an ear infection.  Take the amoxicillin antibiotics as directed.  Follow-up with an ENT if your symptoms do not go away.  We also discussed your blood pressure management, recommend stopping the HCTZ and starting the amlodipine daily.  Important to take it every day.  Follow-up with your primary care doctor for continued blood  pressure management.  Please return to the Emergency Department if you experience any worsening of your condition.   Thank you for allowing [place] to be a part of your care.      Sabas Sous, MD 06/03/23 [phone]

## 2023-06-03 NOTE — Discharge Instructions (Signed)
You were evaluated in the Emergency Department and after careful evaluation, we did not find any emergent condition requiring admission or further testing in the hospital.  Your exam/testing today is overall reassuring.  Symptoms may be due to an ear infection.  Take the amoxicillin antibiotics as directed.  Follow-up with an ENT if your symptoms do not go away.  We also discussed your blood pressure management, recommend stopping the HCTZ and starting the amlodipine daily.  Important to take it every day.  Follow-up with your primary care doctor for continued blood pressure management.  Please return to the Emergency Department if you experience any worsening of your condition.   Thank you for allowing [place] to be a part of your care.

## 2023-06-03 NOTE — ED Triage Notes (Signed)
Pt POV from home reporting R ear fullness x1 month following cold. Hx of same after colds, usually resolves on its own.

## 2023-06-04 ENCOUNTER — Ambulatory Visit (HOSPITAL_COMMUNITY): Payer: Self-pay

## 2023-06-09 ENCOUNTER — Telehealth: Payer: Self-pay

## 2023-06-09 NOTE — Transitions of Care (Post Inpatient/ED Visit) (Signed)
pt was seen at [facility] ED on 06/04/23 with rt earache and elevated BP;pt is feeling much better, pt is taking prescribed abx and taking new BP med amlodipine 5 mg daily;pt  stopped taking hydrochlorothiazide as instructed by ED provider. pt has not taken BP since leaving ED but is not lightheaded and no H/A. No CP,SOB or vision changes.Offered pt appt on 06/09/23 but pt requested appt on 06/16/23.pt scheduled appt with Dr Alphonsus Sias 06/16/23 at 9am with UC & ED precautions. Pt voiced understanding and  appreciative. Pt will try to get BP checked to see how doing now since very elevated at ED.Sending note to Dr Alphonsus Sias.        06/09/2023  Name: Scott Hodge MRN: [id number] DOB: 1980-09-17  Today's TOC FU Call Status: Today's TOC FU Call Status:: Successful TOC FU Call Completed TOC FU Call Complete Date: 06/09/23 Patient's Name and Date of Birth confirmed.  Transition Care Management Follow-up Telephone Call Date of Discharge: 06/04/23 Discharge Facility: [facility] ([facility]) Type of Discharge: Emergency Department Reason for ED Visit: Other: (pt was seen at [facility] ED on 06/04/23 with rt earache and elevated BP;pt is feeling much better, pt is taking prescribed abx and taking new BP med amlodipine 5 mg daily;pt has not taken BP since leaving ED but  is not lightheaded and no H/A.) How have you been since you were released from the hospital?: Better Any questions or concerns?: No  Items Reviewed: Did you receive and understand the discharge instructions provided?: Yes Medications obtained,verified, and reconciled?: Yes (Medications Reviewed) (pt is taking prescribed abx amoxicillin 500 mg tid and amlodipine 5 mg daily; pt stopped HCTZ as instructed at ED and I d/c HCTZ on med list.) Any new allergies since your discharge?: No Dietary orders reviewed?: NA Do you have support at home?: Yes People in Home: spouse Name of Support/Comfort Primary Source: April  Medications Reviewed  Today: Medications Reviewed Today     Reviewed by Patience Musca, LPN (Licensed Practical Nurse) on 06/09/23 at 1022  Med List Status: <None>   Medication Order Taking? Sig Documenting Provider Last Dose Status Informant  amLODipine (NORVASC) 5 MG tablet [id number]  Take 1 tablet (5 mg total) by mouth daily. Sabas Sous, MD  Active   amoxicillin (AMOXIL) 500 MG capsule [id number]  Take 1 capsule (500 mg total) by mouth 3 (three) times daily for 7 days. Sabas Sous, MD  Active     Discontinued 06/09/23 1022 (Discontinued by provider)             Home Care and Equipment/Supplies: Were Home Health Services Ordered?: NA Any new equipment or medical supplies ordered?: NA  Functional Questionnaire: Do you need assistance with bathing/showering or dressing?: No Do you need assistance with meal preparation?: No Do you need assistance with eating?: No Do you have difficulty maintaining continence: No Do you need assistance with getting out of bed/getting out of a chair/moving?: No Do you have difficulty managing or taking your medications?: No  Follow up appointments reviewed: PCP Follow-up appointment confirmed?: Yes Date of PCP follow-up appointment?: 06/16/23 Follow-up Provider: Dr Gerlene Burdock [facility] Follow-up appointment confirmed?: NA Do you need transportation to your follow-up appointment?: No Do you understand care options if your condition(s) worsen?: Yes-patient verbalized understanding    SIGNATURE Lewanda Rife, LPN

## 2023-06-09 NOTE — Telephone Encounter (Signed)
Okay noted

## 2023-06-12 ENCOUNTER — Other Ambulatory Visit: Payer: Self-pay | Admitting: Internal Medicine

## 2023-06-16 ENCOUNTER — Encounter: Payer: Self-pay | Admitting: Internal Medicine

## 2023-06-16 ENCOUNTER — Ambulatory Visit (INDEPENDENT_AMBULATORY_CARE_PROVIDER_SITE_OTHER): Payer: Self-pay | Admitting: Internal Medicine

## 2023-06-16 VITALS — BP 170/104 | HR 91 | Temp 98.8°F | Ht 67.0 in | Wt 262.0 lb

## 2023-06-16 DIAGNOSIS — I10 Essential (primary) hypertension: Secondary | ICD-10-CM

## 2023-06-16 MED ORDER — KETOCONAZOLE 2 % EX CREA: 1.0000 | TOPICAL_CREAM | Freq: Every day | CUTANEOUS | 1 refills | Status: AC | PRN

## 2023-06-16 MED ORDER — VALSARTAN-HYDROCHLOROTHIAZIDE 160-25 MG PO TABS: 1.0000 | ORAL_TABLET | Freq: Every day | ORAL | 3 refills | Status: DC

## 2023-06-16 MED ORDER — AMLODIPINE BESYLATE 5 MG PO TABS: 5.0000 mg | ORAL_TABLET | Freq: Every day | ORAL | 3 refills | Status: DC

## 2023-06-16 NOTE — Assessment & Plan Note (Addendum)
BP Readings from Last 3 Encounters:  06/16/23 (!) 170/104  06/04/23 (!) 174/97  02/13/22 (!) 154/98   Has not been consistent with his meds Now on amlodipine 5mg  Will add back hydrochlorothiazide 25---along with valsartan 160

## 2023-06-16 NOTE — Patient Instructions (Signed)

## 2023-06-16 NOTE — Progress Notes (Signed)
Subjective:    Patient ID: Scott Hodge, male    DOB: Aug 31, 1980, 42 y.o.   MRN: [id number]  HPI Here with wife for ER follow up  Still feels "I ain't been the same" since his father died last year Notes depression---gained weight, didn't want to go out Thinks that affected his blood pressure Has gained 20# since last year Inconsistent with hydrochlorothiazide---trying to make it last  Did just buy BP cuff  Current Outpatient Medications on File Prior to Visit  Medication Sig Dispense Refill   amLODipine (NORVASC) 5 MG tablet Take 1 tablet (5 mg total) by mouth daily. 30 tablet 1   amoxicillin (AMOXIL) 500 MG capsule Take 500 mg by mouth 3 (three) times daily.     No current facility-administered medications on file prior to visit.    No Known Allergies  Past Medical History:  Diagnosis Date   Hypertension     History reviewed. No pertinent surgical history.  Family History  Problem Relation Age of Onset   Hypertension Mother    Hypertension Father    Alcoholism Father    Cancer Paternal Aunt        died of unknown cancer   Diabetes Paternal Uncle    Heart disease Neg Hx     Social History   Socioeconomic History   Marital status: Married    Spouse name: Not on file   Number of children: 1   Years of education: Not on file   Highest education level: Not on file  Occupational History   Occupation: Systems developer studio    Comment: Economist   Occupation: Paediatric nurse  Tobacco Use   Smoking status: Former    Current packs/day: 0.10    Average packs/day: 0.1 packs/day for 2.0 years (0.2 ttl pk-yrs)    Types: Cigarettes   Smokeless tobacco: Never   Tobacco comments:    quit October 2015  Substance and Sexual Activity   Alcohol use: Yes   Drug use: Yes    Types: Marijuana   Sexual activity: Not on file  Other Topics Concern   Not on file  Social History Narrative   2 step children   Daughter born 06/2020   Social Determinants of Health   Financial  Resource Strain: Not on file  Food Insecurity: Not on file  Transportation Needs: Not on file  Physical Activity: Not on file  Stress: Not on file  Social Connections: Not on file  Intimate Partner Violence: Not on file   Review of Systems No FH of early stroke or HTN Hasn't been exercising Doesn't add salt Does eat out--wife starting to cook more Some headache Also with athlete's foot--peeling and itching    Objective:   Physical Exam Constitutional:      Appearance: Normal appearance.  HENT:     Ears:     Comments: ?mild retraction right TM--but no inflammation Cardiovascular:     Rate and Rhythm: Normal rate and regular rhythm.     Heart sounds: No murmur heard.    No gallop.  Pulmonary:     Effort: Pulmonary effort is normal.     Breath sounds: Normal breath sounds. No wheezing or rales.  Musculoskeletal:     Cervical back: Neck supple.     Right lower leg: No edema.     Left lower leg: No edema.  Lymphadenopathy:     Cervical: No cervical adenopathy.  Skin:    Comments: Some intertriginous rash on feet  Neurological:  Mental Status: He is alert.            Assessment & Plan:

## 2023-07-07 ENCOUNTER — Other Ambulatory Visit: Payer: Self-pay | Admitting: Internal Medicine

## 2023-07-07 MED ORDER — AMOXICILLIN-POT CLAVULANATE 875-125 MG PO TABS: 1.0000 | ORAL_TABLET | Freq: Two times a day (BID) | ORAL | 0 refills | Status: AC

## 2023-07-07 NOTE — Progress Notes (Signed)
Seen with husband on video visit Ongoing sinus symptoms Will try augmentin---and he will add OTC cetirizine

## 2023-07-21 ENCOUNTER — Ambulatory Visit: Payer: Self-pay | Admitting: Internal Medicine

## 2023-07-22 ENCOUNTER — Encounter: Payer: Self-pay | Admitting: Internal Medicine

## 2024-04-19 ENCOUNTER — Other Ambulatory Visit: Payer: Self-pay

## 2024-04-19 ENCOUNTER — Emergency Department (HOSPITAL_BASED_OUTPATIENT_CLINIC_OR_DEPARTMENT_OTHER): Payer: Self-pay | Admitting: Radiology

## 2024-04-19 DIAGNOSIS — R0981 Nasal congestion: Secondary | ICD-10-CM | POA: Insufficient documentation

## 2024-04-19 DIAGNOSIS — I10 Essential (primary) hypertension: Secondary | ICD-10-CM | POA: Insufficient documentation

## 2024-04-19 DIAGNOSIS — R059 Cough, unspecified: Secondary | ICD-10-CM | POA: Insufficient documentation

## 2024-04-19 DIAGNOSIS — H9201 Otalgia, right ear: Secondary | ICD-10-CM | POA: Insufficient documentation

## 2024-04-19 DIAGNOSIS — Z79899 Other long term (current) drug therapy: Secondary | ICD-10-CM | POA: Insufficient documentation

## 2024-04-19 NOTE — ED Triage Notes (Signed)
 Pt POV reporting cold sx past few days, denies CP or SOB, also reporting HTN 172/100 at home, has not taken BP meds x2 days due to being out of town.

## 2024-04-20 ENCOUNTER — Emergency Department (HOSPITAL_BASED_OUTPATIENT_CLINIC_OR_DEPARTMENT_OTHER)
Admission: EM | Admit: 2024-04-20 | Discharge: 2024-04-20 | Disposition: A | Discharge status: Home / Self Care | Payer: Self-pay | Attending: Emergency Medicine | Admitting: Emergency Medicine

## 2024-04-20 DIAGNOSIS — I10 Essential (primary) hypertension: Secondary | ICD-10-CM

## 2024-04-20 DIAGNOSIS — J069 Acute upper respiratory infection, unspecified: Secondary | ICD-10-CM

## 2024-04-20 DIAGNOSIS — H6690 Otitis media, unspecified, unspecified ear: Secondary | ICD-10-CM

## 2024-04-20 LAB — BASIC METABOLIC PANEL WITH GFR
Anion gap: 16 — ABNORMAL HIGH (ref 5–15)
BUN: 18 mg/dL (ref 6–20)
CO2: 23 mmol/L (ref 22–32)
Calcium: 9.5 mg/dL (ref 8.9–10.3)
Chloride: 102 mmol/L (ref 98–111)
Creatinine, Ser: 1.68 mg/dL — ABNORMAL HIGH (ref 0.61–1.24)
GFR, Estimated: 51 mL/min — ABNORMAL LOW (ref >60)
Glucose, Bld: 117 mg/dL — ABNORMAL HIGH (ref 70–99)
Potassium: 3.3 mmol/L — ABNORMAL LOW (ref 3.5–5.1)
Sodium: 140 mmol/L (ref 135–145)

## 2024-04-20 LAB — CBC
HCT: 40.5 % (ref 39.0–52.0)
Hemoglobin: 14.3 g/dL (ref 13.0–17.0)
MCH: 28.1 pg (ref 26.0–34.0)
MCHC: 35.3 g/dL (ref 30.0–36.0)
MCV: 79.7 fL — ABNORMAL LOW (ref 80.0–100.0)
Platelets: 355 K/uL (ref 150–400)
RBC: 5.08 MIL/uL (ref 4.22–5.81)
RDW: 13.3 % (ref 11.5–15.5)
WBC: 16.3 K/uL — ABNORMAL HIGH (ref 4.0–10.5)
nRBC: 0 % (ref 0.0–0.2)

## 2024-04-20 LAB — RESP PANEL BY RT-PCR (RSV, FLU A&B, COVID)  RVPGX2
Influenza A by PCR: NEGATIVE
Influenza B by PCR: NEGATIVE
Resp Syncytial Virus by PCR: NEGATIVE
SARS Coronavirus 2 by RT PCR: NEGATIVE

## 2024-04-20 LAB — TROPONIN T, HIGH SENSITIVITY: Troponin T High Sensitivity: 16 ng/L (ref 0–19)

## 2024-04-20 MED ORDER — AZITHROMYCIN 250 MG PO TABS
500.0000 mg | ORAL_TABLET | Freq: Once | ORAL | Status: AC
  Administered 2024-04-20: 500 mg via ORAL
  Filled 2024-04-20: qty 2

## 2024-04-20 MED ORDER — AZITHROMYCIN 250 MG PO TABS: 250.0000 mg | ORAL_TABLET | Freq: Every day | ORAL | 0 refills | Status: AC

## 2024-04-20 NOTE — ED Provider Notes (Signed)
  EMERGENCY DEPARTMENT AT [facility] Provider Note   CSN: [id number] Arrival date & time: 04/19/24  2331     Patient presents with: URI and Hypertension   Scott Hodge is a 43 y.o. male.   Patient is a 43 year old male with history of hypertension.  Patient presenting today with complaints of cough, congestion, right ear pain, and chills.  This started earlier today.  He also checked his blood pressure and found that that it was elevated to 170.  He does report missing his blood pressure medications for the past few days.  He denies any ill contacts.  No aggravating or alleviating factors.       Prior to Admission medications   Medication Sig Start Date End Date Taking? Authorizing Provider  amLODipine  (NORVASC ) 5 MG tablet Take 1 tablet (5 mg total) by mouth daily. 06/16/23   Jimmy Charlie FERNS, MD  amoxicillin  (AMOXIL ) 500 MG capsule Take 500 mg by mouth 3 (three) times daily.    [provider]  amoxicillin -clavulanate (AUGMENTIN ) 875-125 MG tablet Take 1 tablet by mouth 2 (two) times daily. 07/07/23   Jimmy Charlie FERNS, MD  ketoconazole  (NIZORAL ) 2 % cream Apply 1 Application topically daily as needed for irritation. 06/16/23   Jimmy Charlie FERNS, MD  valsartan -hydrochlorothiazide  (DIOVAN -HCT) 160-25 MG tablet Take 1 tablet by mouth daily. 06/16/23   Jimmy Charlie FERNS, MD    Allergies: Patient has no known allergies.    Review of Systems  All other systems reviewed and are negative.   Updated Vital Signs BP (!) 167/113   Pulse 100   Temp 100.1 F (37.8 C) (Oral)   Resp 18   Ht 5' 7 (1.702 m)   Wt 113.4 kg   SpO2 99%   BMI 39.16 kg/m   Physical Exam Vitals and nursing note reviewed.  Constitutional:      General: He is not in acute distress.    Appearance: He is well-developed. He is not diaphoretic.  HENT:     Head: Normocephalic and atraumatic.     Comments: The right TM is erythematous.    Mouth/Throat:     Mouth: Mucous  membranes are moist.     Pharynx: No oropharyngeal exudate or posterior oropharyngeal erythema.  Cardiovascular:     Rate and Rhythm: Normal rate and regular rhythm.     Heart sounds: No murmur heard.    No friction rub.  Pulmonary:     Effort: Pulmonary effort is normal. No respiratory distress.     Breath sounds: Normal breath sounds. No wheezing or rales.  Abdominal:     General: Bowel sounds are normal. There is no distension.     Palpations: Abdomen is soft.     Tenderness: There is no abdominal tenderness.  Musculoskeletal:        General: Normal range of motion.     Cervical back: Normal range of motion and neck supple.  Skin:    General: Skin is warm and dry.  Neurological:     Mental Status: He is alert and oriented to person, place, and time.     Coordination: Coordination normal.     (all labs ordered are listed, but only abnormal results are displayed) Labs Reviewed  BASIC METABOLIC PANEL WITH GFR - Abnormal; Notable for the following components:      Result Value   Potassium 3.3 (*)    Glucose, Bld 117 (*)    Creatinine, Ser 1.68 (*)    GFR, Estimated 51 (*)  Anion gap 16 (*)    All other components within normal limits  CBC - Abnormal; Notable for the following components:   WBC 16.3 (*)    MCV 79.7 (*)    All other components within normal limits  RESP PANEL BY RT-PCR (RSV, FLU A&B, COVID)  [id number]  TROPONIN T, HIGH SENSITIVITY  TROPONIN T, HIGH SENSITIVITY    EKG: EKG Interpretation Date/Time:  Monday April 19 2024 23:42:59 EDT Ventricular Rate:  110 PR Interval:  178 QRS Duration:  74 QT Interval:  334 QTC Calculation: 452 R Axis:   84  Text Interpretation: Sinus tachycardia Abnormal ECG When compared with ECG of 24-Nov-2011 23:40, Vent. rate has increased BY  48 BPM Confirmed by Geroldine Berg ([id number]) on 04/20/2024 12:16:06 AM  Radiology: DG Chest 2 View Result Date: 04/20/2024 CLINICAL DATA:  chest tightness EXAM: CHEST - 2 VIEW  COMPARISON:  Chest x-ray 11/01/2015. FINDINGS: The heart and mediastinal contours are within normal limits. No focal consolidation. No pulmonary edema. No pleural effusion. No pneumothorax. No acute osseous abnormality. IMPRESSION: No active cardiopulmonary disease. Electronically Signed   By: Morgane  Naveau M.D.   On: 04/20/2024 00:00     Procedures   Medications Ordered in the ED  azithromycin  (ZITHROMAX ) tablet 500 mg (has no administration in time range)                                    Medical Decision Making Amount and/or Complexity of Data Reviewed Labs: ordered. Radiology: ordered.  Risk Prescription drug management.   Patient presenting with URI symptoms as described in the HPI.  He arrives here with stable vital signs and is afebrile.  Physical examination is basically unremarkable with the exception of an erythematous right tympanic membrane.    Laboratory studies obtained including CBC, basic metabolic panel, troponin, and respiratory panel.  White count is 16.3, blood glucose is 117 and creatinine is 1.68, but laboratory studies otherwise unremarkable.  He is negative for COVID/flu/RSV.  Chest x-ray showing no acute process.  At this point, suspect some sort of infectious process, likely viral, however he is showing signs of a right otitis media.  He will be treated with Zithromax  and over-the-counter medications.     Final diagnoses:  None    ED Discharge Orders     None          Geroldine Berg, MD 04/20/24 [phone]

## 2024-04-20 NOTE — Discharge Instructions (Signed)
 Begin taking Zithromax  as prescribed.  Begin taking Tylenol  1000 mg rotated with ibuprofen  600 mg every 4 hours as needed for fever or pain.  Resume taking your blood pressure medications as previously prescribed.  Take over-the-counter medications as needed for symptom relief.  Drink plenty of fluids and get plenty of rest.

## 2024-06-25 ENCOUNTER — Other Ambulatory Visit: Payer: Self-pay

## 2024-06-25 ENCOUNTER — Telehealth: Payer: Self-pay | Admitting: General Practice

## 2024-06-25 MED ORDER — VALSARTAN-HYDROCHLOROTHIAZIDE 160-25 MG PO TABS: 1.0000 | ORAL_TABLET | Freq: Every day | ORAL | 0 refills | Status: DC

## 2024-06-25 MED ORDER — AMLODIPINE BESYLATE 5 MG PO TABS: 5.0000 mg | ORAL_TABLET | Freq: Every day | ORAL | 0 refills | Status: DC

## 2024-06-25 NOTE — Telephone Encounter (Signed)
 Copied from CRM [id number]. Topic: Appointments - Transfer of Care >> Jun 25, 2024 10:18 AM Alfonso ORN wrote: Pt is requesting to transfer FROM: Scott Hodge  Pt is requesting to transfer TO: [facility] Reason for requested transfer: pcp no longer active  Pt is needing bridge of medication , establish care appt 12/29 . Refill request submitted

## 2024-06-25 NOTE — Telephone Encounter (Signed)
 Refills were already done by Douglass, NP

## 2024-08-02 ENCOUNTER — Ambulatory Visit: Payer: Self-pay | Admitting: General Practice

## 2024-08-02 DIAGNOSIS — Z91199 Patient's noncompliance with other medical treatment and regimen due to unspecified reason: Secondary | ICD-10-CM

## 2024-08-02 DIAGNOSIS — Z7689 Persons encountering health services in other specified circumstances: Secondary | ICD-10-CM

## 2024-08-02 NOTE — Progress Notes (Signed)
 Patient no-showed today's appointment; appointment was for to establish care.

## 2024-08-04 ENCOUNTER — Other Ambulatory Visit: Payer: Self-pay | Admitting: Internal Medicine

## 2024-08-04 ENCOUNTER — Other Ambulatory Visit: Payer: Self-pay | Admitting: Family

## 2024-08-04 NOTE — Telephone Encounter (Signed)
 Copied from CRM #[id number]. Topic: Clinical - Medication Refill >> Aug 04, 2024 10:48 AM Pinkey ORN wrote: Medication: amLODipine  (NORVASC ) 5 MG tablet, valsartan -hydrochlorothiazide  (DIOVAN -HCT) 160-25 MG tablet  Has the patient contacted their pharmacy? No (Agent: If no, request that the patient contact the pharmacy for the refill. If patient does not wish to contact the pharmacy document the reason why and proceed with request.) (Agent: If yes, when and what did the pharmacy advise?)  This is the patient's preferred pharmacy:   [facility] [address] [place] - [address] [place] [address] Phone: [phone] Fax: [phone]  Is this the correct pharmacy for this prescription? Yes If no, delete pharmacy and type the correct one.   Has the prescription been filled recently? Yes  Is the patient out of the medication? Yes  Has the patient been seen for an appointment in the last year OR does the patient have an upcoming appointment? Yes  Can we respond through MyChart? Yes  Agent: Please be advised that Rx refills may take up to 3 business days. We ask that you follow-up with your pharmacy. >> Aug 04, 2024 10:49 AM Pinkey ORN wrote: Patient states that he's completely out of both medication and is requesting someone follow up with him in rather or not his medications can be filled.

## 2024-08-06 NOTE — Telephone Encounter (Signed)
 Copied from CRM #[id number]. Topic: Clinical - Prescription Issue >> Aug 06, 2024  9:36 AM Ivette P wrote: Reason for CRM: Pt calling in about his blood pressure medication    amLODipine  (NORVASC ) 5 MG tablet, valsartan -hydrochlorothiazide  (DIOVAN -HCT) 160-25 MG tablet    Pt has been out of the medication since 12/30 and would like to be handled before starting to have symptoms. 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

## 2024-08-06 NOTE — Telephone Encounter (Signed)
 Called and spoke to pt. Advised him that he has to keep the appt he has in February to get more refills. He said he will make sure he is at that appt.

## 2024-09-08 ENCOUNTER — Encounter: Payer: Self-pay | Admitting: General Practice
